# Patient Record
Sex: Male | Born: 1985
Health system: Southern US, Community
[De-identification: ages and names within clinical notes are randomized; demographics above are authoritative.]

## PROBLEM LIST (undated history)

## (undated) ENCOUNTER — Emergency Department (HOSPITAL_COMMUNITY): Admission: EM | Payer: Self-pay | Source: Home / Self Care

## (undated) DIAGNOSIS — Z789 Other specified health status: Secondary | ICD-10-CM

---

## 2015-06-22 DIAGNOSIS — M79672 Pain in left foot: Secondary | ICD-10-CM | POA: Diagnosis not present

## 2015-06-22 DIAGNOSIS — B079 Viral wart, unspecified: Secondary | ICD-10-CM | POA: Diagnosis not present

## 2015-06-22 DIAGNOSIS — L859 Epidermal thickening, unspecified: Secondary | ICD-10-CM | POA: Diagnosis not present

## 2015-07-10 DIAGNOSIS — B079 Viral wart, unspecified: Secondary | ICD-10-CM | POA: Diagnosis not present

## 2015-07-31 DIAGNOSIS — B079 Viral wart, unspecified: Secondary | ICD-10-CM | POA: Diagnosis not present

## 2016-02-01 DIAGNOSIS — B079 Viral wart, unspecified: Secondary | ICD-10-CM | POA: Diagnosis not present

## 2016-02-19 DIAGNOSIS — L03119 Cellulitis of unspecified part of limb: Secondary | ICD-10-CM | POA: Diagnosis not present

## 2016-02-19 DIAGNOSIS — B079 Viral wart, unspecified: Secondary | ICD-10-CM | POA: Diagnosis not present

## 2016-04-01 DIAGNOSIS — B079 Viral wart, unspecified: Secondary | ICD-10-CM | POA: Diagnosis not present

## 2016-06-06 ENCOUNTER — Encounter: Payer: Self-pay | Admitting: Podiatry

## 2016-06-06 ENCOUNTER — Ambulatory Visit (INDEPENDENT_AMBULATORY_CARE_PROVIDER_SITE_OTHER): Payer: 59

## 2016-06-06 ENCOUNTER — Ambulatory Visit (INDEPENDENT_AMBULATORY_CARE_PROVIDER_SITE_OTHER): Payer: 59 | Admitting: Podiatry

## 2016-06-06 VITALS — BP 94/74 | HR 67 | Resp 16 | Ht 76.0 in | Wt 170.0 lb

## 2016-06-06 DIAGNOSIS — M79672 Pain in left foot: Secondary | ICD-10-CM

## 2016-06-06 DIAGNOSIS — Q828 Other specified congenital malformations of skin: Secondary | ICD-10-CM

## 2016-06-06 DIAGNOSIS — M216X9 Other acquired deformities of unspecified foot: Secondary | ICD-10-CM

## 2016-06-06 DIAGNOSIS — M779 Enthesopathy, unspecified: Secondary | ICD-10-CM | POA: Diagnosis not present

## 2016-06-06 NOTE — Progress Notes (Signed)
   Subjective:    Patient ID: Timothy PomfretChristopher J Moody, male    DOB: January 13, 1986, 31 y.o.   MRN: [id number]  HPI Chief Complaint  Patient presents with  . Painful lesion    Left foot; plantar forefoot-below 5th toe; pt stated, "[place] to [place] podiatrist in [place] in February 2017 and April 01, 2016 and was told had a callous; did have a biopsy that said had callous; pt stated wants to have a second opinion"      Review of Systems  All other systems reviewed and are negative.      Objective:   Physical Exam        Assessment & Plan:

## 2016-06-08 NOTE — Progress Notes (Signed)
Subjective:     Patient ID: Timothy PomfretChristopher J Linam, male   DOB: 10-03-1985, 31 y.o.   MRN: [id number]  HPI patient presents with very painful lesion subsecond metatarsal left that has been trimmed in the past biopsies been done injection has been done in a remains a difficult problem for him   Review of Systems  All other systems reviewed and are negative.      Objective:   Physical Exam  Constitutional: He is oriented to person, place, and time.  Cardiovascular: Intact distal pulses.   Musculoskeletal: Normal range of motion.  Neurological: He is oriented to person, place, and time.  Skin: Skin is warm.  Nursing note and vitals reviewed.  neurovascular status intact muscle strength was adequate range of motion within normal limits with patient found to have a painful lesion subsecond metatarsal left with fluid buildup and lucent core. It is for the most part underneath the second metatarsal and slightly medial distal but appears to be related to functional problem     Assessment:     Chronic lesion which may be within skin itself or related to mechanical bone condition or combination of the tibia    Plan:     H&P x-rays reviewed condition discussed with family. At this time debridement accomplished and I did go ahead and scanned for orthotic to try to reduce the weightbearing forces against the lesion and we'll see back and I did discuss at one point this may require osteotomy with possible digital procedure. Patient will be seen back for us to reevaluate when orthotics are ready or earlier if needed  X-ray indicated the lesion is slightly distal and medial to the second metatarsal but certainly within the proximity of the bone itself

## 2016-06-09 ENCOUNTER — Encounter (HOSPITAL_COMMUNITY): Payer: Self-pay | Admitting: Emergency Medicine

## 2016-06-09 ENCOUNTER — Emergency Department (HOSPITAL_COMMUNITY)
Admission: EM | Admit: 2016-06-09 | Discharge: 2016-06-09 | Disposition: A | Discharge status: Home / Self Care | Payer: 59 | Attending: Emergency Medicine | Admitting: Emergency Medicine

## 2016-06-09 ENCOUNTER — Emergency Department (HOSPITAL_COMMUNITY): Payer: 59

## 2016-06-09 DIAGNOSIS — F17220 Nicotine dependence, chewing tobacco, uncomplicated: Secondary | ICD-10-CM | POA: Insufficient documentation

## 2016-06-09 DIAGNOSIS — Y999 Unspecified external cause status: Secondary | ICD-10-CM | POA: Diagnosis not present

## 2016-06-09 DIAGNOSIS — W231XXA Caught, crushed, jammed, or pinched between stationary objects, initial encounter: Secondary | ICD-10-CM | POA: Insufficient documentation

## 2016-06-09 DIAGNOSIS — Y929 Unspecified place or not applicable: Secondary | ICD-10-CM | POA: Insufficient documentation

## 2016-06-09 DIAGNOSIS — S61312A Laceration without foreign body of right middle finger with damage to nail, initial encounter: Secondary | ICD-10-CM | POA: Diagnosis not present

## 2016-06-09 DIAGNOSIS — S62632A Displaced fracture of distal phalanx of right middle finger, initial encounter for closed fracture: Secondary | ICD-10-CM | POA: Diagnosis not present

## 2016-06-09 DIAGNOSIS — Z23 Encounter for immunization: Secondary | ICD-10-CM | POA: Insufficient documentation

## 2016-06-09 DIAGNOSIS — S62632B Displaced fracture of distal phalanx of right middle finger, initial encounter for open fracture: Secondary | ICD-10-CM | POA: Diagnosis not present

## 2016-06-09 DIAGNOSIS — S62639B Displaced fracture of distal phalanx of unspecified finger, initial encounter for open fracture: Secondary | ICD-10-CM

## 2016-06-09 DIAGNOSIS — Y9389 Activity, other specified: Secondary | ICD-10-CM | POA: Diagnosis not present

## 2016-06-09 DIAGNOSIS — S61319A Laceration without foreign body of unspecified finger with damage to nail, initial encounter: Secondary | ICD-10-CM

## 2016-06-09 MED ORDER — TETANUS-DIPHTH-ACELL PERTUSSIS 5-2.5-18.5 LF-MCG/0.5 IM SUSP
0.5000 mL | Freq: Once | INTRAMUSCULAR | Status: AC
Start: 2016-06-09 — End: 2016-06-09
  Administered 2016-06-09: 0.5 mL via INTRAMUSCULAR
  Filled 2016-06-09: qty 0.5

## 2016-06-09 MED ORDER — AMOXICILLIN-POT CLAVULANATE 875-125 MG PO TABS: 1.0000 | ORAL_TABLET | Freq: Once | ORAL | Status: DC

## 2016-06-09 MED ORDER — CEPHALEXIN 500 MG PO CAPS: ORAL_CAPSULE | ORAL | 0 refills | Status: DC

## 2016-06-09 MED ORDER — CEPHALEXIN 500 MG PO CAPS
1000.0000 mg | ORAL_CAPSULE | Freq: Once | ORAL | Status: AC
  Administered 2016-06-09: 1000 mg via ORAL
  Filled 2016-06-09: qty 2

## 2016-06-09 MED ORDER — OXYCODONE-ACETAMINOPHEN 5-325 MG PO TABS
1.0000 | ORAL_TABLET | Freq: Once | ORAL | Status: AC
  Administered 2016-06-09: 1 via ORAL
  Filled 2016-06-09: qty 1

## 2016-06-09 MED ORDER — FENTANYL CITRATE (PF) 100 MCG/2ML IJ SOLN
100.0000 ug | Freq: Once | INTRAMUSCULAR | Status: AC
  Administered 2016-06-09: 100 ug via INTRAMUSCULAR
  Filled 2016-06-09: qty 2

## 2016-06-09 MED ORDER — LIDOCAINE HCL (PF) 1 % IJ SOLN
5.0000 mL | Freq: Once | INTRAMUSCULAR | Status: AC
  Administered 2016-06-09: 5 mL
  Filled 2016-06-09: qty 5

## 2016-06-09 MED ORDER — POVIDONE-IODINE 10 % EX SOLN
CUTANEOUS | Status: AC
  Filled 2016-06-09: qty 118

## 2016-06-09 MED ORDER — OXYCODONE-ACETAMINOPHEN 5-325 MG PO TABS: 1.0000 | ORAL_TABLET | Freq: Three times a day (TID) | ORAL | 0 refills | Status: DC | PRN

## 2016-06-09 NOTE — ED Triage Notes (Signed)
Pt reports was cutting wood and reports "pinched" right middle finger between two pieces. Pt moderately diaphoretic. Bleeding controlled at this time. Laceration extends around anterior tip of right middle finger.

## 2016-06-09 NOTE — ED Provider Notes (Signed)
[facility]EM[facility]Y DEPT Provider Note   CSN: [id number] Arrival date & time: 06/09/16  1250  By signing my name below, I, Timothy Moody, attest that this documentation has been prepared under the direction and in the presence of [name], MD. Electronically Signed: Modena Moody, Scribe. 06/09/2016. 1:40 PM.  History   Chief Complaint Chief Complaint  Patient presents with  . Laceration   The history is provided by the patient. No language interpreter was used.   HPI Comments: Timothy Moody is a 31 y.o. male who presents to the Emergency Department complaining of right 4th finger wound that occurred an hour ago. He was working with wood when he accidentally got his fingertip smashed between two pieces of wood. He had a bandage placed PTA. Unsure of tetanus status. He is right-handed. He denies any other complaints.   History reviewed. No pertinent past medical history.  There are no active problems to display for this patient.   History reviewed. No pertinent surgical history.     Home Medications    Prior to Admission medications   Medication Sig Start Date End Date Taking? Authorizing Provider  cephALEXin (KEFLEX) 500 MG capsule 2 caps po bid x 7 days 06/09/16   [name], MD  oxyCODONE-acetaminophen (PERCOCET) 5-325 MG tablet Take 1-2 tablets by mouth every 8 (eight) hours as needed for severe pain. 06/09/16   [name], MD    Family History History reviewed. No pertinent family history.  Social History Social History  Substance Use Topics  . Smoking status: Never Smoker  . Smokeless tobacco: Current User    Types: Chew  . Alcohol use Not on file     Allergies   Patient has no known allergies.   Review of Systems Review of Systems  Skin: Positive for wound (Right 4th finger).    Physical Exam Updated Vital Signs BP 115/70 (BP Location: Left Arm)   Pulse 63   Temp 97.7 F (36.5 C) (Oral)   Resp 18   Ht 6\' 4"  (1.93 m)   Wt 170 lb (77.1 kg)    SpO2 100%   BMI 20.69 kg/m   Physical Exam  Constitutional: He appears well-developed and well-nourished. No distress.  HENT:  Head: Normocephalic and atraumatic.  Eyes: Conjunctivae are normal.  Neck: Neck supple.  Cardiovascular: Normal rate.   Pulmonary/Chest: Effort normal.  Abdominal: Soft.  Musculoskeletal: Normal range of motion.  Neurological: He is alert.  Skin: Skin is warm and dry.  From volar aspect to proximal nailbed. Finger has good sensation distal to wound. Unable to assess cap. Refill. Dorsal aspect is dusky.   Psychiatric: He has a normal mood and affect.  Nursing note and vitals reviewed.      After cleaning and nail removal:    Approximated as best as possible      ED Treatments / Results  DIAGNOSTIC STUDIES: Oxygen Saturation is 100% on RA, normal by my interpretation.    COORDINATION OF CARE: 1:44 PM- Pt advised of plan for treatment and pt agrees.  Labs (all labs ordered are listed, but only abnormal results are displayed) Labs Reviewed - No data to display  EKG  EKG Interpretation None       Radiology Dg Finger Middle Right  Result Date: 06/09/2016 CLINICAL DATA:  Right third finger injury EXAM: RIGHT MIDDLE FINGER 2+V COMPARISON:  None. FINDINGS: Overlying bandage obscures fine bone detail. Comminuted non articular fracture of the distal tuft of the distal phalanx in the right third  finger with minimal 1-2 mm volar displacement of the dominant distal fracture fragment. No additional fracture. No dislocation. No suspicious focal osseous lesion. Minimal osteoarthritis in the DIP joint right third finger. No radiopaque foreign body. IMPRESSION: Comminuted non articular distal tuft fracture in the distal phalanx of the right third finger with minimal displacement as described. Electronically Signed   By: Delbert PhenixJason A Poff M.D.   On: 06/09/2016 13:24    Procedures .Marland Kitchen.Laceration Repair Date/Time: 06/09/2016 4:44 PM Performed by: Marily MemosMESNER,  Shoshannah Faubert Authorized by: Marily MemosMESNER, Mckenzie Toruno   Consent:    Consent obtained:  Verbal   Consent given by:  Patient   Risks discussed:  Infection, pain, poor cosmetic result, poor wound healing, vascular damage, tendon damage, nerve damage and need for additional repair   Alternatives discussed:  No treatment and delayed treatment Anesthesia (see MAR for exact dosages):    Anesthesia method:  Nerve block   Block location:  Digital, fifth digit, palmar crease   Block needle gauge:  24 G   Block anesthetic:  Lidocaine 1% w/o epi   Block technique:  Digital    Block injection procedure:  Anatomic landmarks identified, introduced needle and negative aspiration for blood   Block outcome:  Anesthesia achieved Laceration details:    Location:  Finger   Finger location:  R long finger   Length (cm):  3   Depth (mm):  8 Repair type:    Repair type:  Complex Pre-procedure details:    Preparation:  Patient was prepped and draped in usual sterile fashion and imaging obtained to evaluate for foreign bodies Exploration:    Hemostasis achieved with:  Tourniquet   Wound exploration: wound explored through full range of motion and entire depth of wound probed and visualized     Wound extent: areolar tissue violated, fascia violated, muscle damage, tendon damage, underlying fracture and vascular damage     Wound extent: no foreign bodies/material noted and no nerve damage noted     Contaminated: no   Treatment:    Area cleansed with:  Betadine and saline   Amount of cleaning:  Standard   Irrigation solution:  Sterile saline   Irrigation volume:  200   Irrigation method:  Syringe   Visualized foreign bodies/material removed: no     Debridement:  Moderate   Undermining:  Minimal   Scar revision: no   Skin repair:    Repair method:  Sutures   Suture size:  4-0   Suture material:  Prolene   Suture technique:  Simple interrupted   Number of sutures:  4 Approximation:    Approximation:   Close Post-procedure details:    Dressing:  Sterile dressing, bulky dressing, splint for protection and tube gauze   Patient tolerance of procedure:  Tolerated well, no immediate complications .Nerve Block Date/Time: 06/09/2016 4:47 PM Performed by: Marily MemosMESNER, Chrystina Naff Authorized by: Marily MemosMESNER, Alexes Menchaca   Consent:    Consent obtained:  Verbal   Consent given by:  Patient Indications:    Indications:  Procedural anesthesia Location:    Nerve block body site: right long finger, digital.   Laterality:  Right Pre-procedure details:    Skin preparation:  Povidone-iodine   Preparation: Patient was prepped and draped in usual sterile fashion   Skin anesthesia (see MAR for exact dosages):    Skin anesthesia method:  None Procedure details (see MAR for exact dosages):    Block needle gauge:  25 G   Guidance comment:  Anatomic landmark   Anesthetic injected:  Lidocaine 1% w/o epi   Steroid injected:  None   Additive injected:  None   Injection procedure:  Anatomic landmarks identified   Paresthesia:  Immediately resolved Post-procedure details:    Dressing:  Sterile dressing   Outcome:  Anesthesia achieved   Patient tolerance of procedure:  Tolerated well, no immediate complications   (including critical care time)  Medications Ordered in ED Medications  povidone-iodine (BETADINE) 10 % external solution (not administered)  lidocaine (PF) (XYLOCAINE) 1 % injection 5 mL (5 mLs Other Given 06/09/16 1424)  fentaNYL (SUBLIMAZE) injection 100 mcg (100 mcg Intramuscular Given 06/09/16 1422)  Tdap (BOOSTRIX) injection 0.5 mL (0.5 mLs Intramuscular Given 06/09/16 1423)  cephALEXin (KEFLEX) capsule 1,000 mg (1,000 mg Oral Given 06/09/16 1546)  oxyCODONE-acetaminophen (PERCOCET/ROXICET) 5-325 MG per tablet 1 tablet (1 tablet Oral Given 06/09/16 1643)     Initial Impression / Assessment and Plan / ED Course  I have reviewed the triage vital signs and the nursing notes.  Pertinent labs & imaging results  that were available during my care of the patient were reviewed by me and considered in my medical decision making (see chart for details).     32 year old male with finger injury as documented by PICTURES above. Patient with significant injury to nail bed, matrix and underlying phalanx. Tissue approximated as well as possible with remaining viable tissue. It covers the bone. I discussed that the nail will likely never grow normal and is a possibility this will not heal well he may need a partial amputation he and his family understood. I did discuss this with Dr. Romeo Apple on the phone and will see the patient in follow-up. Family will call and get a follow-up appointment this week. Secondary to the fracture and the complexity of the wound overlying it after being smashed by a piece of wood I will start him on antibiotics. Discharged on pain medicine as well. We'll keep dressing in place until he sees Dr. Romeo Apple.  Final Clinical Impressions(s) / ED Diagnoses   Final diagnoses:  Laceration of right middle finger without foreign body with damage to nail, initial encounter  Open fracture of tuft of distal phalanx of finger  Nailbed laceration, finger, initial encounter    New Prescriptions New Prescriptions   CEPHALEXIN (KEFLEX) 500 MG CAPSULE    2 caps po bid x 7 days   OXYCODONE-ACETAMINOPHEN (PERCOCET) 5-325 MG TABLET    Take 1-2 tablets by mouth every 8 (eight) hours as needed for severe pain.   I personally performed the services described in this documentation, which was scribed in my presence. The recorded information has been reviewed and is accurate.     [name], MD 06/09/16 [phone]

## 2016-06-10 ENCOUNTER — Encounter: Payer: Self-pay | Admitting: Orthopedic Surgery

## 2016-06-10 ENCOUNTER — Ambulatory Visit (INDEPENDENT_AMBULATORY_CARE_PROVIDER_SITE_OTHER): Payer: 59 | Admitting: Orthopedic Surgery

## 2016-06-10 VITALS — BP 129/83 | HR 64 | Ht 76.0 in | Wt 163.0 lb

## 2016-06-10 DIAGNOSIS — S62662A Nondisplaced fracture of distal phalanx of right middle finger, initial encounter for closed fracture: Secondary | ICD-10-CM | POA: Diagnosis not present

## 2016-06-10 MED ORDER — OXYCODONE-ACETAMINOPHEN 5-325 MG PO TABS: 1.0000 | ORAL_TABLET | Freq: Three times a day (TID) | ORAL | 0 refills | Status: DC | PRN

## 2016-06-10 NOTE — Progress Notes (Signed)
Patient ID: Timothy Moody, male   DOB: December 03, 1985, 31 y.o.   MRN: [id number]  Chief Complaint  Patient presents with  . Hand Injury    right long finger crush injury, DOI 06/09/16   MEDICAL DECISION MAKING:    Data Reviewed I reviewed his x-rays and reports. My interpretation is a distal tuft fracture minimal displacement  Assessment Encounter Diagnosis  Name Primary?  . Closed nondisplaced fracture of distal phalanx of right middle finger, initial encounter Yes     Plan We change the dressing was changed again on Friday. Stitches out in 7-10 days post injury  Follow-up Friday  HPI Timothy PomfretChristopher J Mariscal is a 31 y.o. male.  31 year old male was cutting some wood the log dropped on his right long finger and he injured the distal tip sustaining an open distal tuft fracture which was treated in the emergency room yesterday date of injury  Complains of pain.  Review of Systems Review of Systems He denies numbness in the fingertip  No past medical history on file.  No past surgical history on file.  Social History Social History  Substance Use Topics  . Smoking status: Never Smoker  . Smokeless tobacco: Current User    Types: Chew  . Alcohol use Not on file    No Known Allergies  Current Meds  Medication Sig  . cephALEXin (KEFLEX) 500 MG capsule 2 caps po bid x 7 days  . oxyCODONE-acetaminophen (PERCOCET) 5-325 MG tablet Take 1 tablet by mouth every 8 (eight) hours as needed for severe pain.  . [DISCONTINUED] oxyCODONE-acetaminophen (PERCOCET) 5-325 MG tablet Take 1-2 tablets by mouth every 8 (eight) hours as needed for severe pain.      Physical Exam Physical Exam 1.BP 129/83   Pulse 64   Ht 6\' 4"  (1.93 m)   Wt 163 lb (73.9 kg)   BMI 19.84 kg/m   2. Gen. appearance. The patient is well-developed and well-nourished, grooming and hygiene are normal. There are no gross congenital abnormalities  3. The patient is alert and oriented to person place and  time  4. Mood and affect are normal  Right long finger multiple stitches are in place overall alignment is reasonable. He has normal sensation if not increased sensation at the tip of the finger. He cannot really flex the fingertip at this point which is not unusual. Capillary refill was good.   Fuller CanadaStanley Nikoleta Dady 06/10/2016, 4:10 PM

## 2016-06-10 NOTE — Patient Instructions (Signed)
Keep clean and keep dry

## 2016-06-13 ENCOUNTER — Ambulatory Visit (INDEPENDENT_AMBULATORY_CARE_PROVIDER_SITE_OTHER): Payer: 59 | Admitting: Orthopedic Surgery

## 2016-06-13 DIAGNOSIS — S62662A Nondisplaced fracture of distal phalanx of right middle finger, initial encounter for closed fracture: Secondary | ICD-10-CM | POA: Diagnosis not present

## 2016-06-16 NOTE — Progress Notes (Signed)
Status post closed nondisplaced fracture of the distal phalanx right long finger  Dressing was changed wound looked clean dry and intact  Significant other is a nurse and she will change the dressing daily  Follow-up next week for suture removal

## 2016-06-17 ENCOUNTER — Encounter: Payer: Self-pay | Admitting: Orthopedic Surgery

## 2016-06-17 ENCOUNTER — Ambulatory Visit (INDEPENDENT_AMBULATORY_CARE_PROVIDER_SITE_OTHER): Payer: 59 | Admitting: Orthopedic Surgery

## 2016-06-17 DIAGNOSIS — S62662A Nondisplaced fracture of distal phalanx of right middle finger, initial encounter for closed fracture: Secondary | ICD-10-CM

## 2016-06-17 NOTE — Progress Notes (Signed)
FOLLOW UP VISIT   Patient ID: Timothy Moody, male   DOB: June 05, 1985, 31 y.o.   MRN: [id number]  Chief Complaint  Patient presents with  . Follow-up    RIGHT MIDDLE FINGER WOUND, DOI 06/09/16    HPI Timothy Moody is a 31 y.o. male.   HPI  8 DAYS POST CRUSH INJURY RIGHT LONG FINGER   DAILY DRESSING CHANGES   SUTURES OUT TODAY   HES ON KEFLEX AND OXYCODONE    Review of Systems Review of Systems   Physical Exam  MILD SWELLING , CLEAN WOUND BED    MEDICAL DECISION MAKING  DATA     Encounter Diagnosis  Name Primary?  . Closed nondisplaced fracture of distal phalanx of right middle finger, initial encounter Yes    DRESSINGS DAILY START SOAKING WITH SALT AND ANTIBACTERIAL DISH DETERGENT   PLAN(RISK)    RETURN WEDS FOR WOUND CHECK

## 2016-06-27 ENCOUNTER — Ambulatory Visit (INDEPENDENT_AMBULATORY_CARE_PROVIDER_SITE_OTHER): Payer: 59 | Admitting: Orthopedic Surgery

## 2016-06-27 DIAGNOSIS — S62662D Nondisplaced fracture of distal phalanx of right middle finger, subsequent encounter for fracture with routine healing: Secondary | ICD-10-CM

## 2016-06-27 MED ORDER — CEPHALEXIN 500 MG PO CAPS: ORAL_CAPSULE | ORAL | 2 refills | Status: DC

## 2016-06-27 NOTE — Addendum Note (Signed)
Addended by: Fuller Canada E on: 06/27/2016 11:15 AM   Modules accepted: Orders

## 2016-06-27 NOTE — Progress Notes (Signed)
Chief Complaint  Patient presents with  . Wound Check    RIGHT MIDDLE FINGER DOI-06/09/16    Current medications Keflex and oxycodone  Daily dressing changes  Status post crushing injury to right long finger  Meds ordered this encounter  Medications  . cephALEXin (KEFLEX) 500 MG capsule    Sig: 2 caps po bid x 7 days    Dispense:  56 capsule    Refill:  2    It looks good at this point no odor ,   Flexion is 80%  DRESSING CHANGES AND ANTIBIOTICS   2 WK FU

## 2016-06-27 NOTE — Patient Instructions (Signed)
Resume keflex  Daily dressing changes and soak

## 2016-06-30 ENCOUNTER — Other Ambulatory Visit: Payer: 59

## 2016-07-11 ENCOUNTER — Ambulatory Visit: Payer: 59 | Admitting: Orthopedic Surgery

## 2016-07-14 ENCOUNTER — Ambulatory Visit (INDEPENDENT_AMBULATORY_CARE_PROVIDER_SITE_OTHER): Payer: 59 | Admitting: Orthopedic Surgery

## 2016-07-14 ENCOUNTER — Encounter: Payer: Self-pay | Admitting: Orthopedic Surgery

## 2016-07-14 DIAGNOSIS — S62662D Nondisplaced fracture of distal phalanx of right middle finger, subsequent encounter for fracture with routine healing: Secondary | ICD-10-CM | POA: Diagnosis not present

## 2016-07-14 NOTE — Progress Notes (Signed)
Chief Complaint  Patient presents with  . Follow-up    right long finger crush injury, DOI 06/09/16    Follow-up crush injury right long finger. It looks really good today. No male coming just yet but as far as infection none seen or noted.  He's regain full range of motion  Continue soaks cover to keep clean and follow-up with me in a month

## 2016-08-13 ENCOUNTER — Ambulatory Visit (HOSPITAL_COMMUNITY)
Admission: EM | Admit: 2016-08-13 | Discharge: 2016-08-13 | Disposition: A | Discharge status: Home / Self Care | Payer: 59 | Attending: Family Medicine | Admitting: Family Medicine

## 2016-08-13 ENCOUNTER — Encounter (HOSPITAL_COMMUNITY): Payer: Self-pay | Admitting: Emergency Medicine

## 2016-08-13 DIAGNOSIS — J028 Acute pharyngitis due to other specified organisms: Secondary | ICD-10-CM | POA: Diagnosis not present

## 2016-08-13 MED ORDER — LIDOCAINE VISCOUS 2 % MT SOLN: OROMUCOSAL | 0 refills | Status: DC

## 2016-08-13 MED ORDER — AMOXICILLIN 875 MG PO TABS: 875.0000 mg | ORAL_TABLET | Freq: Two times a day (BID) | ORAL | 0 refills | Status: DC

## 2016-08-13 NOTE — ED Triage Notes (Signed)
Pt has been suffering from a sore throat, N&V for two days.  Report of feeling hot, but no measured temperature taken at home.

## 2016-08-13 NOTE — ED Provider Notes (Signed)
CSN: [id number]     Arrival date & time 08/13/16  1004 History   First MD Initiated Contact with Patient 08/13/16 1034     Chief Complaint  Patient presents with  . Sore Throat  . Fever   (Consider location/radiation/quality/duration/timing/severity/associated sxs/prior Treatment) Patient c/o sore throat and N/V for 2 days.   The history is provided by the patient and a relative.  Sore Throat  This is a new problem. The current episode started 2 days ago. The problem occurs constantly. The problem has not changed since onset.Nothing aggravates the symptoms. He has tried nothing for the symptoms.  Fever  Associated symptoms: congestion and sore throat     History reviewed. No pertinent past medical history. History reviewed. No pertinent surgical history. History reviewed. No pertinent family history. Social History  Substance Use Topics  . Smoking status: Never Smoker  . Smokeless tobacco: Current User    Types: Chew  . Alcohol use Not on file    Review of Systems  Constitutional: Positive for fever.  HENT: Positive for congestion and sore throat.   Eyes: Negative.   Respiratory: Negative.   Cardiovascular: Negative.   Gastrointestinal: Negative.   Endocrine: Negative.   Genitourinary: Negative.   Musculoskeletal: Negative.   Allergic/Immunologic: Negative.   Neurological: Negative.   Hematological: Negative.   Psychiatric/Behavioral: Negative.     Allergies  Patient has no known allergies.  Home Medications   Prior to Admission medications   Medication Sig Start Date End Date Taking? Authorizing Provider  amoxicillin (AMOXIL) 875 MG tablet Take 1 tablet (875 mg total) by mouth 2 (two) times daily. 08/13/16   Deatra Canter, FNP  lidocaine (XYLOCAINE) 2 % solution Take 10 ml po q 4 hours prn throat pain 08/13/16   Deatra Canter, FNP   Meds Ordered and Administered this Visit  Medications - No data to display  BP 140/62 (BP Location: Left Arm)   Pulse  89   Temp 99.5 F (37.5 C) (Oral)   SpO2 98%  No data found.   Physical Exam  Constitutional: He is oriented to person, place, and time. He appears well-developed and well-nourished.  HENT:  Head: Normocephalic and atraumatic.  Right Ear: External ear normal.  Left Ear: External ear normal.  Nose: Nose normal.  opx erythematous and tonsils 2 plus with exudates.  Eyes: Conjunctivae and EOM are normal. Pupils are equal, round, and reactive to light.  Neck: Normal range of motion. Neck supple.  Cardiovascular: Normal rate, regular rhythm and normal heart sounds.   Pulmonary/Chest: Effort normal and breath sounds normal.  Abdominal: Soft. Bowel sounds are normal.  Musculoskeletal: Normal range of motion.  Lymphadenopathy:    He has cervical adenopathy.  Neurological: He is alert and oriented to person, place, and time.  Nursing note and vitals reviewed.   Urgent Care Course     Procedures (including critical care time)  Labs Review Labs Reviewed - No data to display  Imaging Review No results found.   Visual Acuity Review  Right Eye Distance:   Left Eye Distance:   Bilateral Distance:    Right Eye Near:   Left Eye Near:    Bilateral Near:         MDM   1. Pharyngitis due to other organism    Lidocaine viscous Amoxicillin  Push po fluids, rest, tylenol and motrin otc prn as directed for fever, arthralgias, and myalgias.  Follow up prn if sx's continue or persist.  Deatra CanterOxford, Raaga Maeder J, FNP 08/13/16 1137

## 2016-08-15 ENCOUNTER — Encounter: Payer: Self-pay | Admitting: Orthopedic Surgery

## 2016-08-15 ENCOUNTER — Ambulatory Visit (INDEPENDENT_AMBULATORY_CARE_PROVIDER_SITE_OTHER): Payer: 59 | Admitting: Orthopedic Surgery

## 2016-08-15 DIAGNOSIS — S62662D Nondisplaced fracture of distal phalanx of right middle finger, subsequent encounter for fracture with routine healing: Secondary | ICD-10-CM | POA: Diagnosis not present

## 2016-08-15 NOTE — Progress Notes (Signed)
Chief Complaint  Patient presents with  . Follow-up    RIGHT LONG FINGER INJURY, DOI 06/09/16    Timothy Moody had an injury to his right long finger and we treated it with antibiotics and soaks and he did well. Comes in today complaining of some purulent material expressed within the last day or 2 from the cuticle area. He also gives history of recent diagnosis of strep so is on amoxicillin  Exam shows a regular nail full range of motion normal sensation and area of erythema at the edge of the cuticle no expressible purulence a day  Recommend soak daily with warm water and Epsom salt continuous 7 day course of amoxicillin and follow-up in 4 weeks   Encounter Diagnosis  Name Primary?  . Closed nondisplaced fracture of distal phalanx of right middle finger with routine healing, subsequent encounter Yes

## 2016-09-12 ENCOUNTER — Ambulatory Visit: Payer: 59 | Admitting: Orthopedic Surgery

## 2016-09-15 ENCOUNTER — Encounter: Payer: Self-pay | Admitting: Orthopedic Surgery

## 2016-09-15 ENCOUNTER — Ambulatory Visit (INDEPENDENT_AMBULATORY_CARE_PROVIDER_SITE_OTHER): Payer: 59 | Admitting: Orthopedic Surgery

## 2016-09-15 DIAGNOSIS — S62662D Nondisplaced fracture of distal phalanx of right middle finger, subsequent encounter for fracture with routine healing: Secondary | ICD-10-CM

## 2016-09-15 NOTE — Progress Notes (Signed)
31 year old male follow-up status post right  long finger injury he has regained full range of motion is no tenderness his nail is slightly deformed. He will follow-up with us as needed

## 2017-05-05 ENCOUNTER — Encounter: Payer: Self-pay | Admitting: Family Medicine

## 2017-05-05 ENCOUNTER — Ambulatory Visit (INDEPENDENT_AMBULATORY_CARE_PROVIDER_SITE_OTHER): Payer: No Typology Code available for payment source | Admitting: Family Medicine

## 2017-05-05 VITALS — BP 138/72 | Ht 75.5 in | Wt 171.6 lb

## 2017-05-05 DIAGNOSIS — Z0000 Encounter for general adult medical examination without abnormal findings: Secondary | ICD-10-CM | POA: Diagnosis not present

## 2017-05-05 DIAGNOSIS — Z79899 Other long term (current) drug therapy: Secondary | ICD-10-CM | POA: Diagnosis not present

## 2017-05-05 DIAGNOSIS — Z13220 Encounter for screening for lipoid disorders: Secondary | ICD-10-CM | POA: Diagnosis not present

## 2017-05-05 NOTE — Progress Notes (Signed)
   Subjective:    Patient ID: Timothy PomfretChristopher J Yakubov, male    DOB: Dec 24, 1985, 32 y.o.   MRN: [id number]  HPI The patient comes in today for a wellness visit.    A review of their health history was completed.  A review of medications was also completed.  Any needed refills;  Eating habits: good, "eats what he wants"  Falls/  MVA accidents in past few months: no  Regular exercise: yes  Specialist pt sees on regular basis: none Preventative health issues were discussed.   Additional concerns: none  Plenty execise with work , does heavy Regulatory affairs officerwood cutting  Eats a bit late fast food  No hospitalziations  No major surgeries   n dips some   occas  alcohl use  Couple beeers here and there    Review of Systems  Constitutional: Negative for activity change, appetite change and fever.  HENT: Negative for congestion and rhinorrhea.   Eyes: Negative for discharge.  Respiratory: Negative for cough and wheezing.   Cardiovascular: Negative for chest pain.  Gastrointestinal: Negative for abdominal pain, blood in stool and vomiting.  Genitourinary: Negative for difficulty urinating and frequency.  Musculoskeletal: Negative for neck pain.  Skin: Negative for rash.  Allergic/Immunologic: Negative for environmental allergies and food allergies.  Neurological: Negative for weakness and headaches.  Psychiatric/Behavioral: Negative for agitation.  All other systems reviewed and are negative.      Objective:    Physical Exam  Constitutional: He appears well-developed and well-nourished.  HENT:  Head: Normocephalic and atraumatic.  Right Ear: External ear normal.  Left Ear: External ear normal.  Nose: Nose normal.  Mouth/Throat: Oropharynx is clear and moist.  Eyes: Right eye exhibits no discharge. Left eye exhibits no discharge. No scleral icterus.  Neck: Normal range of motion. Neck supple. No thyromegaly present.  Cardiovascular: Normal rate, regular rhythm and normal heart  sounds.  No murmur heard. Pulmonary/Chest: Effort normal and breath sounds normal. No respiratory distress. He has no wheezes.  Abdominal: Soft. Bowel sounds are normal. He exhibits no distension and no mass. There is no tenderness.  Genitourinary: Penis normal.  Musculoskeletal: Normal range of motion. He exhibits no edema.  Lymphadenopathy:    He has no cervical adenopathy.  Neurological: He is alert. He exhibits normal muscle tone. Coordination normal.  Skin: Skin is warm and dry. No erythema.  Psychiatric: He has a normal mood and affect. His behavior is normal. Judgment normal.  Vitals reviewed.         Assessment & Plan:  Impression 1 well adult exam.  Diet discussed.  Exercise discussed.  Appropriate blood work recommended.  Patient dips tobacco advised to quit

## 2018-10-04 IMAGING — DX DG FINGER MIDDLE 2+V*R*
3 series · 3 of 3 positions shown · non-contrast
Comparison: None.

CLINICAL DATA: Right third finger injury

EXAM:
RIGHT MIDDLE FINGER 2+V

[finger ap]
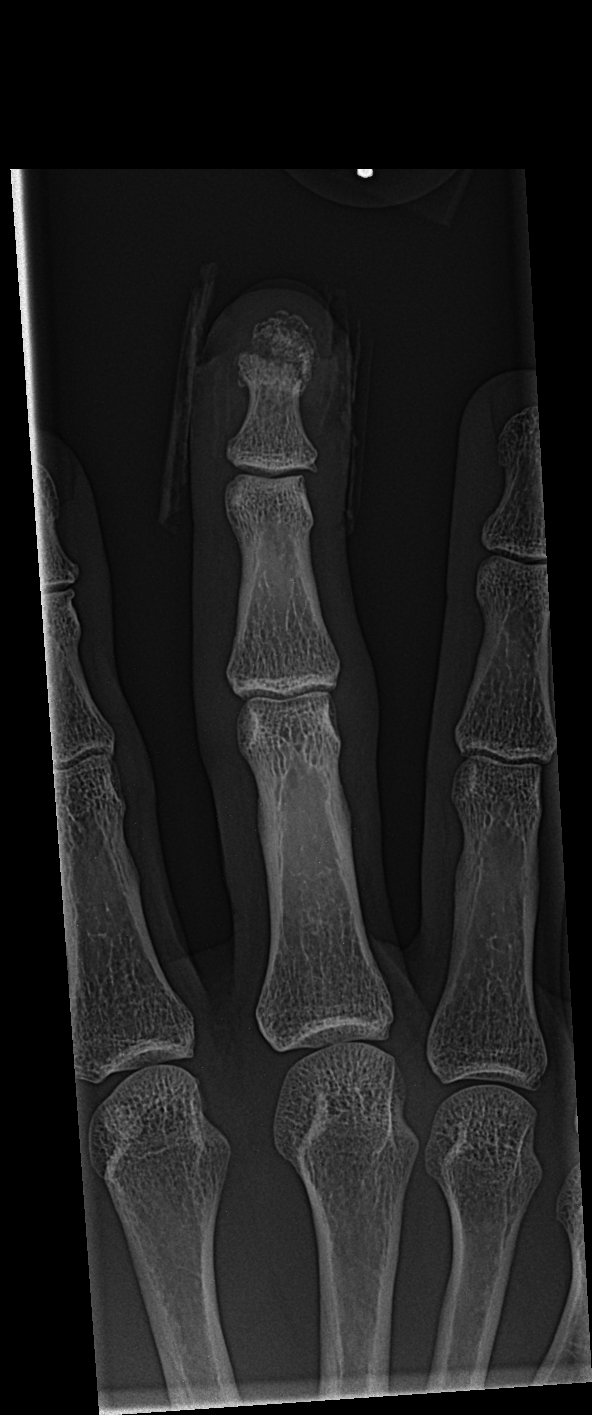

[finger obl]
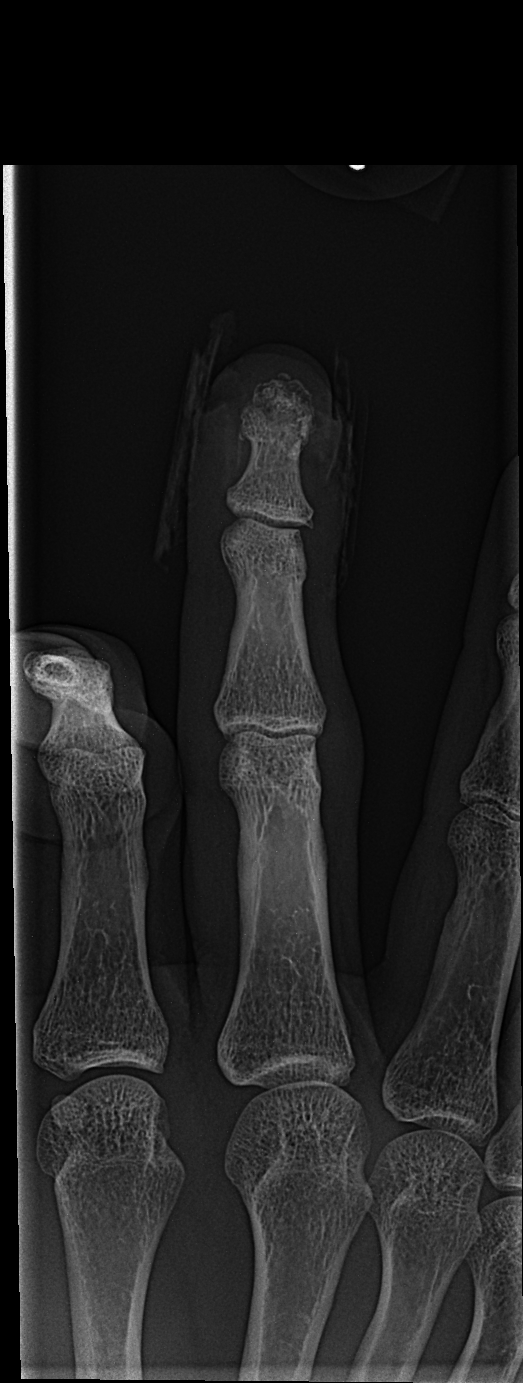

[finger lat]
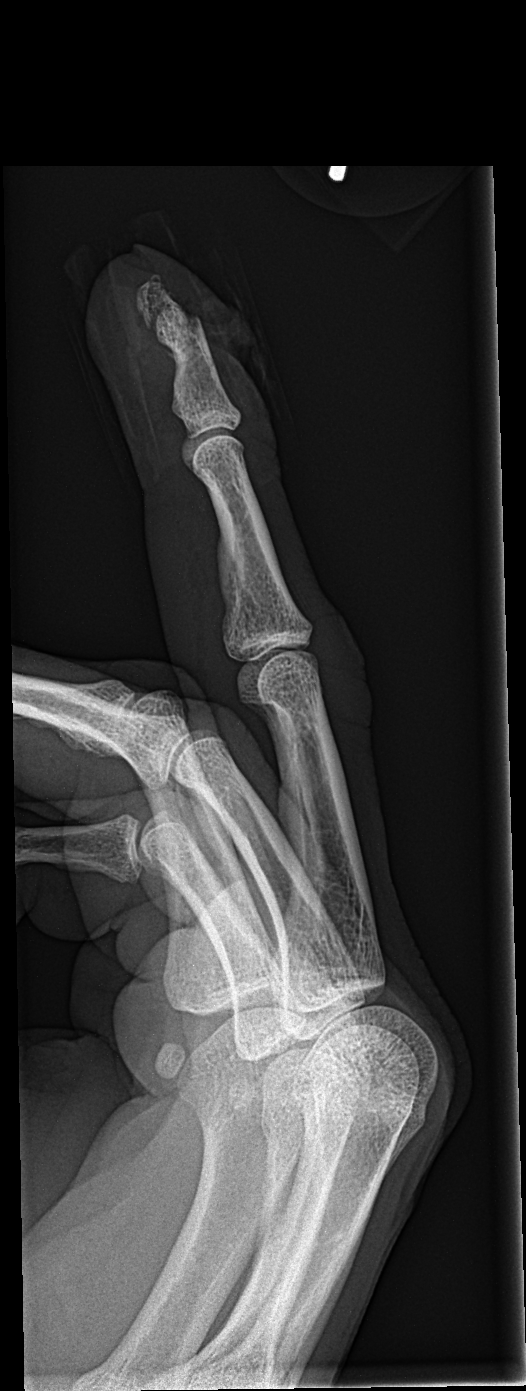

[3 of 3 positions shown; findings below may reference images not displayed]

FINDINGS: Overlying bandage obscures fine bone detail. Comminuted non
articular fracture of the distal tuft of the distal phalanx in the
right third finger with minimal 1-2 mm volar displacement of the
dominant distal fracture fragment. No additional fracture. No
dislocation. No suspicious focal osseous lesion. Minimal
osteoarthritis in the DIP joint right third finger. No radiopaque
foreign body.
IMPRESSION: Comminuted non articular distal tuft fracture in the distal phalanx
of the right third finger with minimal displacement as described.

## 2018-10-27 ENCOUNTER — Telehealth: Payer: Self-pay | Admitting: Family Medicine

## 2018-10-27 DIAGNOSIS — M25561 Pain in right knee: Secondary | ICD-10-CM

## 2018-10-27 NOTE — Telephone Encounter (Signed)
Patient notified

## 2018-10-27 NOTE — Telephone Encounter (Signed)
Referral ordered in Epic. Left message to return call to notify patient. 

## 2018-10-27 NOTE — Telephone Encounter (Signed)
Pt needs referral to Dr. Aline Brochure, right knee pain & swelling 2-3 months, worsening   Call pt - [phone]    Pt has [facility] plan & referral is required

## 2018-10-27 NOTE — Telephone Encounter (Signed)
Let's do 

## 2018-11-03 ENCOUNTER — Encounter: Payer: Self-pay | Admitting: Orthopedic Surgery

## 2018-11-03 ENCOUNTER — Other Ambulatory Visit: Payer: Self-pay

## 2018-11-03 ENCOUNTER — Ambulatory Visit: Payer: No Typology Code available for payment source | Admitting: Orthopedic Surgery

## 2018-11-03 VITALS — BP 124/81 | HR 75 | Ht 75.5 in | Wt 170.0 lb

## 2018-11-03 DIAGNOSIS — M7041 Prepatellar bursitis, right knee: Secondary | ICD-10-CM | POA: Diagnosis not present

## 2018-11-03 NOTE — Progress Notes (Signed)
Timothy Moody  11/03/2018  HISTORY SECTION :  Chief Complaint  Patient presents with  . Knee Pain    right knee swelling at patella    HPI The patient presents for evaluation of swelling right knee patella no trauma other than his work.  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  Has had swelling over the knee for 4 to 6 weeks not really too much pain may be some mild discomfort not getting better  Review of Systems  All other systems reviewed and are negative.    History reviewed. No pertinent past medical history.  Denies diabetes hypertension or heart disease History reviewed. No pertinent surgical history.  No prior surgery   No Known Allergies  No current outpatient medications on file.   PHYSICAL EXAM SECTION: 1) BP 124/81   Pulse 75   Ht 6' 3.5" (1.918 m)   Wt 170 lb (77.1 kg)   BMI 20.97 kg/m   Body mass index is 20.97 kg/m. General appearance: Well-developed well-nourished no gross deformities  2) Cardiovascular normal pulse and perfusion in all 4 extremities normal color without edema  3) Neurologically deep tendon reflexes are equal and normal, no sensation loss or deficits no pathologic reflexes  4) Psychological: Awake alert and oriented x3 mood and affect normal  5) Skin no lacerations or ulcerations no nodularity no palpable masses, no erythema or nodularity  6) Musculoskeletal: *  Left knee looks normal nontender normal range of motion  Right knee large prepatellar bursa no tenderness no erythema full range of motion of the knee knee is stable strength is normal muscle tone excellent  MEDICAL DECISION SECTION:  Encounter Diagnosis  Name Primary?  . Prepatellar bursitis of right knee Yes    Imaging No x-rays  Plan:  (Rx., Inj., surg., Frx, MRI/CT, XR:2)  Aspiration injection right prepatellar bursa  Patient gave consent Timeout was taken [place].  Skin was cleaned with alcohol prep with ethyl  chloride 18-gauge needle aspirated 78 cc of blood we injected 1 and half cc of Depo-Medrol with lidocaine  3:26 PM Arther Abbott, MD  11/03/2018

## 2018-11-03 NOTE — Patient Instructions (Signed)
Prepatellar Bursitis  Prepatellar bursitis is inflammation of the prepatellar bursa, which is a fluid-filled sac that cushions the kneecap (patella). Prepatellar bursitis happens when fluid builds up in this sac and causes it to swell. The condition causes knee pain. What are the causes? This condition may be caused by:  Constant pressure on the knees from kneeling.  A hit to the knee.  Falling on the knee.  Infection from bacteria.  Moving the knee often in a forceful way. What increases the risk? You are more likely to develop this condition if:  You play sports that have a high risk of falling on the knee or being hit on the knee. These include football, wrestling, basketball, or soccer.  You do work in which you kneel for long periods of time, such as roofing, plumbing, or gardening.  You have another inflammatory condition, such as gout or rheumatoid arthritis. What are the signs or symptoms? The most common symptom of this condition is knee pain that gets better with rest. Other symptoms include:  Swelling on the front of the kneecap.  Warmth in the knee.  Tenderness with activity.  Redness in the knee.  Inability to bend the knee or to kneel. How is this diagnosed? This condition is diagnosed based on:  A physical exam. Your health care provider will compare your knees and check for tenderness and pain while moving your knee.  Your medical history.  Tests to check for infection. These may include blood tests and tests on the fluid in the bursa.  Imaging tests, such as X-ray, MRI, or ultrasound, to check for damage in the patella, or fluid buildup and swelling in the bursa. How is this treated? This condition may be treated by:  Resting the knee.  Putting ice on the knee.  Taking medicines, such as: ? NSAIDs. These can help to reduce pain and swelling. ? Antibiotics. These may be needed if you have an infection. ? Steroids. These are used to reduce  swelling and inflammation, and may be prescribed if other treatments are not helping.  Raising (elevating) the knee while resting.  Doing exercises to help you maintain movement (physical therapy). These may be recommended after pain and swelling improve.  Having a procedure to remove fluid from the bursa. This may be done if other treatments are not helping.  Having surgery to remove the bursa. This may be done if you have a severe infection or if the condition keeps coming back after treatment. Follow these instructions at home: Medicines  Take over-the-counter and prescription medicines only as told by your health care provider.  If you were prescribed an antibiotic medicine, take it as told by your health care provider. Do not stop taking the antibiotic even if you start to feel better. Managing pain, stiffness, and swelling   If directed, put ice on the injured area. ? Put ice in a plastic bag. ? Place a towel between your skin and the bag. ? Leave the ice on for 20 minutes, 2-3 times a day.  Elevate the injured area above the level of your heart while you are sitting or lying down. Activity  Do not use the injured limb to support your body weight until your health care provider says that you can.  Rest your knee.  Avoid activities that cause knee pain.  Return to your normal activities as told by your health care provider. Ask your health care provider what activities are safe for you.  Do exercises as   told by your health care provider. General instructions  Ask your health care provider when it is safe for you to drive.  Do not use any products that contain nicotine or tobacco, such as cigarettes, e-cigarettes, and chewing tobacco. These can delay healing. If you need help quitting, ask your health care provider.  Keep all follow-up visits as told by your health care provider. This is important. How is this prevented?  Warm up and stretch before being active.   Cool down and stretch after being active.  Give your body time to rest between periods of activity.  Maintain physical fitness, including strength and flexibility.  Be safe and responsible while being active. This will help you to avoid falls.  Wear knee pads if you have to kneel for a long period of time. Contact a health care provider if:  Your symptoms do not improve or get worse.  Your symptoms keep coming back after treatment.  You develop a fever and have warmth, redness, or swelling over your knee. Summary  Prepatellar bursitis is inflammation of the prepatellar bursa, which is a fluid-filled sac that cushions the kneecap (patella).  This condition may be caused by injury or constant pressure on the knee. It may also be caused by an infection from bacteria.  Symptoms of this condition include pain, swelling, warmth, and tenderness in the knee.  Follow instructions from your health care provider about taking medicines, resting, and doing activities.  Contact your health care provider if your symptoms do not improve, get worse, or keep coming back after treatment. This information is not intended to replace advice given to you by your health care provider. Make sure you discuss any questions you have with your health care provider. Document Released: 03/10/2005 Document Revised: 07/02/2018 Document Reviewed: 05/20/2018 Elsevier Patient Education  2020 [employer]  

## 2019-03-30 ENCOUNTER — Other Ambulatory Visit: Payer: Self-pay

## 2019-03-30 ENCOUNTER — Ambulatory Visit: Payer: No Typology Code available for payment source | Attending: Internal Medicine

## 2019-03-30 DIAGNOSIS — Z20822 Contact with and (suspected) exposure to covid-19: Secondary | ICD-10-CM

## 2019-03-31 ENCOUNTER — Telehealth: Payer: Self-pay

## 2019-03-31 LAB — NOVEL CORONAVIRUS, NAA: SARS-CoV-2, NAA: DETECTED — AB

## 2019-03-31 NOTE — Telephone Encounter (Signed)
Patient called in requesting COVID19 lab results  - DOB/Address verified - Confirmed Positive results given. Reviewed Patient Positive test results and MyChart Companion Algorithm for Home Monitoring of COVID19 with patient/spouse.  Patient reports the following symptoms: headache, fatigue and runny nose.   Patient/spouse verbalized understanding of instructions, no further questions.

## 2019-04-25 ENCOUNTER — Encounter: Payer: Self-pay | Admitting: Family Medicine

## 2019-06-30 ENCOUNTER — Encounter: Payer: Self-pay | Admitting: Family Medicine

## 2019-06-30 ENCOUNTER — Ambulatory Visit (INDEPENDENT_AMBULATORY_CARE_PROVIDER_SITE_OTHER): Payer: No Typology Code available for payment source | Admitting: Family Medicine

## 2019-06-30 ENCOUNTER — Other Ambulatory Visit: Payer: Self-pay

## 2019-06-30 VITALS — BP 138/82 | Temp 97.2°F | Wt 165.0 lb

## 2019-06-30 DIAGNOSIS — M5432 Sciatica, left side: Secondary | ICD-10-CM | POA: Diagnosis not present

## 2019-06-30 MED ORDER — PREDNISONE 20 MG PO TABS: ORAL_TABLET | ORAL | 0 refills | Status: DC

## 2019-06-30 NOTE — Progress Notes (Signed)
   Subjective:    Patient ID: Timothy Moody, male    DOB: 1985-07-23, 34 y.o.   MRN: [id number]  HPI Patient comes in today with complaints of left upper leg/hip pain. Patient states it has been bothering him for about 4 weeks now and is worse when sitting down or stretching, he does not recall doing anything that might have caused it.  wrks at tree work  Stays active an wroks hard  Worse in the eve time  Not painful at work  Worse when sitting for periods of time  Notes pain with forward clexion     Not much pain in the morn   No headache no chest pain no shortness of breath  Review of Systems     Objective:   Physical Exam  Alert vitals stable, NAD. Blood pressure good on repeat. HEENT normal. Lungs clear. Heart regular rate and rhythm. Plus minus straight leg raise on left.  Positive contralateral straight leg raise on right.  Some tenderness with deep palpation to left sciatic notch.      Assessment & Plan:  Impression probable sciatica.  Not classic presentation.  Forward neck flexion seems to aggravate as does straight leg raise positioning.  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  Will give protracted prednisone taper hopefully this will help calm things down.  If not follow-up

## 2019-07-21 ENCOUNTER — Other Ambulatory Visit: Payer: Self-pay

## 2019-07-21 ENCOUNTER — Encounter: Payer: Self-pay | Admitting: Family Medicine

## 2019-07-21 ENCOUNTER — Ambulatory Visit (INDEPENDENT_AMBULATORY_CARE_PROVIDER_SITE_OTHER): Payer: No Typology Code available for payment source | Admitting: Family Medicine

## 2019-07-21 VITALS — BP 120/82 | HR 78 | Temp 97.6°F | Ht 75.5 in | Wt 166.2 lb

## 2019-07-21 DIAGNOSIS — G5702 Lesion of sciatic nerve, left lower limb: Secondary | ICD-10-CM

## 2019-07-21 MED ORDER — DICLOFENAC SODIUM 75 MG PO TBEC: 75.0000 mg | DELAYED_RELEASE_TABLET | Freq: Two times a day (BID) | ORAL | 0 refills | Status: DC

## 2019-07-21 NOTE — Patient Instructions (Signed)
Piriformis Syndrome  Piriformis syndrome is a condition that can cause pain and numbness in your buttocks and down the back of your leg. Piriformis syndrome happens when the small muscle that connects the base of your spine to your hip (piriformis muscle) presses on the nerve that runs down the back of your leg (sciatic nerve). The piriformis muscle helps your hip rotate and helps to bring your leg back and out. It also helps shift your weight to keep you stable while you are walking. The sciatic nerve runs under or through the piriformis muscle. Damage to the piriformis muscle can cause spasms that put pressure on the nerve below. This causes pain and discomfort while sitting and moving. The pain may feel as if it begins in the buttock and spreads (radiates) down your hip and thigh. What are the causes? This condition is caused by pressure on the sciatic nerve from the piriformis muscle. The piriformis muscle can get irritated with overuse, especially if other hip muscles are weak and the piriformis muscle has to do extra work. Piriformis syndrome can also occur after an injury, like a fall onto your buttocks. What increases the risk? You are more likely to develop this condition if you:  Are a woman.  Sit for long periods of time.  Are a cyclist.  Have weak buttocks muscles (gluteal muscles). What are the signs or symptoms? Symptoms of this condition include:  Pain, tingling, or numbness that starts in the buttock and runs down the back of your leg (sciatica).  Pain in the groin or thigh area. Your symptoms may get worse:  The longer you sit.  When you walk, run, or climb stairs.  When straining to have a bowel movement. How is this diagnosed? This condition is diagnosed based on your symptoms, medical history, and physical exam.  During the exam, your health care provider may: ? Move your leg into different positions to check for pain. ? Press on the muscles of your hip and  buttock to see if that increases your symptoms.  You may also have tests, including: ? Imaging tests such as X-rays, MRI, or ultrasound. ? Electromyogram (EMG). This test measures electrical signals sent by your nerves into the muscles. ? Nerve conduction study. This test measures how well electrical signals pass through your nerves. How is this treated? This condition may be treated by:  Stopping all activities that cause pain or make your condition worse.  Applying ice or using heat therapy.  Taking medicines to reduce pain and swelling.  Taking a muscle relaxer (muscle relaxant) to stop muscle spasms.  Doing range-of-motion and strengthening exercises (physical therapy) as told by your health care provider.  Massaging the area.  Having acupuncture.  Getting an injection of medicine in the piriformis muscle. Your health care provider will choose the medicine based on your condition. He or she may inject: ? An anti-inflammatory medicine (steroid) to reduce swelling. ? A numbing medicine (local anesthetic) to block the pain. ? Botulinum toxin. The toxin blocks nerve impulses to specific muscles to reduce muscle tension. In rare cases, you may need surgery to cut the muscle and release pressure on the nerve if other treatments do not work. Follow these instructions at home: Activity  Do not sit for long periods. Get up and walk around every 20 minutes or as often as told by your health care provider. ? When driving long distances, make sure to take frequent stops to get up and stretch.  Use a   cushion when you sit on hard surfaces.  Do exercises as told by your health care provider.  Return to your normal activities as told by your health care provider. Ask your health care provider what activities are safe for you. Managing pain, stiffness, and swelling      If directed, apply heat to the affected area as often as told by your health care provider. Use the heat source that  your health care provider recommends, such as a moist heat pack or a heating pad. ? Place a towel between your skin and the heat source. ? Leave the heat on for 20-30 minutes. ? Remove the heat if your skin turns bright red. This is especially important if you are unable to feel pain, heat, or cold. You may have a greater risk of getting burned.  If directed, put ice on the injured area. ? Put ice in a plastic bag. ? Place a towel between your skin and the bag. ? Leave the ice on for 20 minutes, 2-3 times a day. General instructions  Take over-the-counter and prescription medicines only as told by your health care provider.  Ask your health care provider if the medicine prescribed to you requires you to avoid driving or using heavy machinery.  You may need to take actions to prevent or treat constipation, such as: ? Drink enough fluid to keep your urine pale yellow. ? Take over-the-counter or prescription medicines. ? Eat foods that are high in fiber, such as beans, whole grains, and fresh fruits and vegetables. ? Limit foods that are high in fat and processed sugars, such as fried or sweet foods.  Keep all follow-up visits as told by your health care provider. This is important. How is this prevented?  Do not sit for longer than 20 minutes at a time. When you sit, choose padded surfaces.  Warm up and stretch before being active.  Cool down and stretch after being active.  Give your body time to rest between periods of activity.  Make sure to use equipment that fits you.  Maintain physical fitness, including: ? Strength. ? Flexibility. Contact a health care provider if:  Your pain and stiffness continue or get worse.  Your leg or hip becomes weak.  You have changes in your bowel function or bladder function. Summary  Piriformis syndrome is a condition that can cause pain, tingling, and numbness in your buttocks and down the back of your leg.  You may try applying heat  or ice to relieve the pain.  Do not sit for long periods. Get up and walk around every 20 minutes or as often as told by your health care provider. This information is not intended to replace advice given to you by your health care provider. Make sure you discuss any questions you have with your health care provider. Document Revised: 07/01/2018 Document Reviewed: 11/04/2017 Elsevier Patient Education  2020 [employer] Inc.  

## 2019-07-21 NOTE — Progress Notes (Signed)
Patient ID: Timothy Moody, male    DOB: 1985-12-09, 34 y.o.   MRN: [id number]   Chief Complaint  Patient presents with  . Sciatica    follow up   Subjective:   HPI  HPI Patient was seen recently on 06/30/19 with left sided sciatica and given prednisone taper. Patient states it got better while on prednisone but returned when he finished prednisone. Hasn't taken any other meds for this pain. Pt stating trying to do some stretches at home.  Worsening pain in the left buttock and left leg when flexion on spine and flexion of neck. No trauma or injury to back or leg.  No previous surgeries on back.  Denies saddle anesthesia or bowel/bladder incontinence. No problems with sleep or pain in back in AM. Worse when getting home from work and sitting or driving long periods of time.   Medical History Etan has no past medical history on file.   Outpatient Encounter Medications as of 07/21/2019  Medication Sig  . diclofenac (VOLTAREN) 75 MG EC tablet Take 1 tablet (75 mg total) by mouth 2 (two) times daily.  . [DISCONTINUED] predniSONE (DELTASONE) 20 MG tablet Take 3 tablets per day for 4 days then 2 tablets per day for 3 days then 1 tablet per day for 3 days.   No facility-administered encounter medications on file as of 07/21/2019.     Review of Systems  Constitutional: Negative for chills and fever.  HENT: Negative for congestion, rhinorrhea and sore throat.   Respiratory: Negative for cough, shortness of breath and wheezing.   Cardiovascular: Negative for chest pain and leg swelling.  Gastrointestinal: Negative for abdominal pain, diarrhea, nausea and vomiting.  Genitourinary: Negative for dysuria and frequency.  Musculoskeletal: Positive for back pain (left buttock pain). Negative for arthralgias, gait problem, neck pain and neck stiffness.  Skin: Negative for rash.  Neurological: Negative for dizziness, weakness, light-headedness, numbness and headaches.      Vitals BP 120/82   Pulse 78   Temp 97.6 F (36.4 C) (Oral)   Ht 6' 3.5" (1.918 m)   Wt 166 lb 3.2 oz (75.4 kg)   SpO2 98%   BMI 20.50 kg/m   Objective:   Physical Exam Constitutional:      General: He is not in acute distress.    Appearance: Normal appearance.  HENT:     Head: Normocephalic and atraumatic.  Musculoskeletal:        General: No swelling, tenderness, deformity or signs of injury. Normal range of motion.     Cervical back: Normal range of motion and neck supple.       Back:     Right lower leg: No edema.     Left lower leg: No edema.     Comments: Normal skin inspection-back.  Skin:    General: Skin is warm and dry.     Findings: No lesion or rash.  Neurological:     General: No focal deficit present.     Mental Status: He is alert and oriented to person, place, and time.     Cranial Nerves: No cranial nerve deficit.     Sensory: No sensory deficit.     Motor: No weakness.     Gait: Gait normal.      Assessment and Plan   1. Piriformis syndrome of left side - diclofenac (VOLTAREN) 75 MG EC tablet; Take 1 tablet (75 mg total) by mouth 2 (two) times daily.  Dispense: 60 tablet; Refill: 0 -  Ambulatory referral to Physical Therapy    Advising diclofenac for next 3-4 wks with food. ordered PT for piriformis syndrome. Gave exercises to do at home and instructions on piriformis syndrome.  Heating pad 3x per day 28mins each.  May need imaging if not improving.  Pt in agreement with plan.   F/u if worsening or prn.

## 2019-08-02 ENCOUNTER — Encounter (HOSPITAL_COMMUNITY): Payer: Self-pay | Admitting: Physical Therapy

## 2019-08-02 ENCOUNTER — Other Ambulatory Visit: Payer: Self-pay

## 2019-08-02 ENCOUNTER — Ambulatory Visit (HOSPITAL_COMMUNITY): Payer: No Typology Code available for payment source | Attending: Family Medicine | Admitting: Physical Therapy

## 2019-08-02 DIAGNOSIS — M545 Low back pain, unspecified: Secondary | ICD-10-CM

## 2019-08-02 DIAGNOSIS — M25552 Pain in left hip: Secondary | ICD-10-CM | POA: Insufficient documentation

## 2019-08-02 NOTE — Therapy (Signed)
[place], [place], [address] Phone: [phone]   Fax:  [phone]  Physical Therapy Treatment  Patient Details  Name: Timothy Moody MRN: [id number] Date of Birth: Nov 23, 1985 Referring Provider (PT): Malena Lovena Le   Encounter Date: 08/02/2019  PT End of Session - 08/02/19 1549    Visit Number  1    Number of Visits  8    Date for PT Re-Evaluation  09/27/19   eval 08/02/19   Authorization Type  [employer], no ded, no auth    Progress Note Due on Visit  10    PT Start Time  1500    PT Stop Time  1545    PT Time Calculation (min)  45 min    Activity Tolerance  Patient tolerated treatment well    Behavior During Therapy  [facility] for tasks assessed/performed       History reviewed. No pertinent past medical history.  History reviewed. No pertinent surgical history.  There were no vitals filed for this visit.  Subjective Assessment - 08/02/19 1550    Subjective  Patient complains of buttocks/leg pain in left leg that started about 3 months ago. Sitting and driving make it worse with it reaching 9/10 pain shooting down his leg. States walking and standing is fine. He feels it more when he bends forward and when he flexes his neck. Reports he is pretty active with his job and kids but doesn't regularly work out. States current pain is 1/10. Pain is worse with transitional movements.    Limitations  Sitting    Currently in Pain?  Yes    Pain Score  1     Pain Location  Hip    Pain Orientation  Left;Posterior    Pain Descriptors / Indicators  Aching    Pain Type  Acute pain    Pain Radiating Towards  down leg         [facility] PT Assessment - 08/02/19 0001      Assessment   Medical Diagnosis  left piriformis syndrome    Referring Provider (PT)  Malena Taylor      Precautions   Precautions  None      Balance Screen   Has the patient fallen in the past 6 months  No      [place] residence    Available Help at Discharge  Family    Type of [place]      Prior Function   Level of Independence  Independent    Vocation  Full time employment    Vocation Requirements  cuts down trees      Cognition   Overall Cognitive Status  Within Functional Limits for tasks assessed      Observation/Other Assessments   Observations  sacral sitting, slumped sitting noted     Focus on Therapeutic Outcomes (FOTO)   22% limited       ROM / Strength   AROM / PROM / Strength  AROM;Strength      AROM   AROM Assessment Site  Lumbar    Lumbar Flexion  0% limited   pain in low back/hip coming back up    Lumbar Extension  50% limited     Lumbar - Right Side Bend  50% limited   pain on left side   Lumbar - Left Side Bend  50% limited     Lumbar - Right  Rotation  25% limited    Lumbar - Left Rotation  25% limited      Strength   Strength Assessment Site  Hip    Right/Left Hip  Right;Left    Right Hip Extension  4+/5    Left Hip Extension  4+/5      Flexibility   Soft Tissue Assessment /Muscle Length  yes    Hamstrings  --   90/90 test - 30 degrees from full ext B, no symptom increase     Palpation   Spinal mobility  hypomobility in lumbar spine noted - no reproductions of symptoms    Palpation comment  tenderness in left glute med, not in piriformis.       Special Tests   Other special tests  neg SLR B, neg ely's test B      Transfers   Comments  transitioning from supine to sit and stand to sit patient presented with pain in hip.                    [facility] Adult PT Treatment/Exercise - 08/02/19 0001      Exercises   Exercises  Knee/Hip      Knee/Hip Exercises: Standing   Other Standing Knee Exercises  self mobilization with tennis ball to low back at wall     Other Standing Knee Exercises  towel roll across lumbar spine - no change - 1/2 foam across low back  2-3 minutes at L5       Knee/Hip Exercises: Sidelying   Other  Sidelying Knee/Hip Exercises  book stretch x10 10" holds B             PT Education - 08/02/19 1544    Education Details  on current condition, on presentation, on HEP and rationale for exercises.    Person(s) Educated  Patient    Methods  Explanation;Handout    Comprehension  Verbalized understanding       PT Short Term Goals - 08/02/19 1548      PT SHORT TERM GOAL #1   Title  Patient will be independent in self management strategies to improve quality of life and functional outcomes.    Time  4    Period  Weeks    Status  New    Target Date  08/30/19      PT SHORT TERM GOAL #2   Title  Patient will report at least 50% improvement in overall symptoms and/or functional ability.    Time  4    Period  Weeks    Status  New    Target Date  08/30/19        PT Long Term Goals - 08/02/19 1548      PT LONG TERM GOAL #1   Title  Patient will report at least 75% improvement in overall symptoms and/or functional ability.    Time  8    Period  Weeks    Status  New    Target Date  09/27/19      PT LONG TERM GOAL #2   Title  Patient will be able to demonstrate painfree lumbar mobility to improve gross mobility    Time  8    Period  Weeks    Status  New    Target Date  09/27/19            Plan - 08/02/19 1534    Clinical Impression Statement  Patient presents to therapy with left buttocks/hip pain  that radiates down leg. Symptoms note consistent with true piriformis syndrome secondary to no tenderness or tightness noted in any hip musculature. SLR and slump test negative but symptoms to seem to have fascial component secondary to increased pain with cervical flexion. Decreased symptoms noted after lumbar extension mobilization over foam roller. Educated patient on current condition and plan moving forward. Patient would greatly benefit from skilled physical therapy to improve functional mobility and decrease risk of further injury.    Personal Factors and Comorbidities   Age    Examination-Activity Limitations  Sit;Transfers    Examination-Participation Restrictions  Driving    Stability/Clinical Decision Making  Stable/Uncomplicated    Clinical Decision Making  Low    Rehab Potential  Good    PT Frequency  --   1-2x/week for total of 8 visits over 8 week certification   PT Duration  8 weeks    PT Treatment/Interventions  ADLs/Self Care Home Management;Aquatic Therapy;Cryotherapy;Electrical Stimulation;Moist Heat;Traction;Iontophoresis 4mg /ml Dexamethasone;Therapeutic exercise;Therapeutic activities;Functional mobility training;Neuromuscular re-education;Patient/family education;Manual techniques;Passive range of motion;Dry needling    PT Next Visit Plan  lumbar mobility, lumbar mobilizations, fascial mobilization/stretches    PT Home Exercise Plan  5/11 self mobilization with tennis ball, book stretch (lumbar rotation), lumbar extension over towel roll    Consulted and Agree with Plan of Care  Patient       Patient will benefit from skilled therapeutic intervention in order to improve the following deficits and impairments:  Pain, Postural dysfunction, Decreased mobility  Visit Diagnosis: Pain in left hip  Lumbar pain     Problem List There are no problems to display for this patient.  4:06 PM, 08/02/19 10/02/19, DPT Physical Therapy with [facility]  [phone] office  [facility] [address] [place], [place], [place] Phone: [phone]   Fax:  [phone]  Name: Timothy Moody MRN: Germaine Pomfret Date of Birth: 09-03-1985

## 2019-08-02 NOTE — Patient Instructions (Signed)
Access Code: [email or web address] URL: [email or web address] Date: 08/02/2019 Prepared by: Revonda Humphrey  Exercises Sidelying Thoracic Lumbar Rotation - 1 sets - 10 reps - 5 hold Self Release Lumbar extension over 1/2 foam

## 2019-08-12 ENCOUNTER — Other Ambulatory Visit: Payer: Self-pay

## 2019-08-12 ENCOUNTER — Ambulatory Visit (HOSPITAL_COMMUNITY): Payer: No Typology Code available for payment source | Admitting: Physical Therapy

## 2019-08-12 ENCOUNTER — Encounter (HOSPITAL_COMMUNITY): Payer: Self-pay | Admitting: Physical Therapy

## 2019-08-12 DIAGNOSIS — M25552 Pain in left hip: Secondary | ICD-10-CM

## 2019-08-12 DIAGNOSIS — M545 Low back pain, unspecified: Secondary | ICD-10-CM

## 2019-08-12 NOTE — Patient Instructions (Signed)
Access Code: [email or web address] URL: [email or web address] Date: 08/12/2019 Prepared by: Georges Lynch  Exercises Prone Press Up - 3 x daily - 7 x weekly - 2 sets - 10 reps Supine Sciatic Nerve Glide - 3 x daily - 7 x weekly - 2 sets - 10 reps Supine Bridge - 3 x daily - 7 x weekly - 2 sets - 10 reps - 5 hold

## 2019-08-12 NOTE — Therapy (Signed)
[facility] [address] [place], [place], [address] Phone: [phone]   Fax:  [phone]  Physical Therapy Treatment  Patient Details  Name: Timothy Moody MRN: [id number] Date of Birth: 06-Aug-1985 Referring Provider (PT): Malena Ladona Ridgel   Encounter Date: 08/12/2019  PT End of Session - 08/12/19 1315    Visit Number  2    Number of Visits  8    Date for PT Re-Evaluation  09/27/19   eval 08/02/19   Authorization Type  [employer], no ded, no auth    Progress Note Due on Visit  10    PT Start Time  1304    PT Stop Time  1344    PT Time Calculation (min)  40 min    Activity Tolerance  Patient tolerated treatment well    Behavior During Therapy  [facility] for tasks assessed/performed       History reviewed. No pertinent past medical history.  History reviewed. No pertinent surgical history.  There were no vitals filed for this visit.  Subjective Assessment - 08/12/19 1314    Subjective  Patient says HEP exercises have been helpful but only for a brief period of time. Reports ongoing pain in LLE and posterior hip.    Limitations  Sitting    Currently in Pain?  Yes    Pain Score  2     Pain Location  Hip    Pain Orientation  Posterior;Left    Pain Descriptors / Indicators  Aching;Sore    Pain Type  Acute pain    Pain Onset  More than a month ago    Pain Frequency  Intermittent                        [facility] Adult PT Treatment/Exercise - 08/12/19 0001      Exercises   Exercises  Lumbar      Lumbar Exercises: Stretches   Press Ups  2 reps;10 reps      Lumbar Exercises: Supine   Bridge  10 reps;5 seconds    Other Supine Lumbar Exercises  supine sciatic nerve glide LT x15             PT Education - 08/12/19 1341    Education Details  on further assessment findings, exercise technique and HEP handout    Person(s) Educated  Patient    Methods  Explanation;Handout    Comprehension  Verbalized  understanding       PT Short Term Goals - 08/02/19 1548      PT SHORT TERM GOAL #1   Title  Patient will be independent in self management strategies to improve quality of life and functional outcomes.    Time  4    Period  Weeks    Status  New    Target Date  08/30/19      PT SHORT TERM GOAL #2   Title  Patient will report at least 50% improvement in overall symptoms and/or functional ability.    Time  4    Period  Weeks    Status  New    Target Date  08/30/19        PT Long Term Goals - 08/02/19 1548      PT LONG TERM GOAL #1   Title  Patient will report at least 75% improvement in overall symptoms and/or functional ability.    Time  8    Period  Weeks  Status  New    Target Date  09/27/19      PT LONG TERM GOAL #2   Title  Patient will be able to demonstrate painfree lumbar mobility to improve gross mobility    Time  8    Period  Weeks    Status  New    Target Date  09/27/19            Plan - 08/12/19 1342    Clinical Impression Statement  Patient tolerated session well today. Further assessed lumbar mobility and response to repeated movement testing. Patient with no reported effect of repeated flexion and extend in standing. Patient did note mild cramping in LLE with initial reps of hip bridge. Performed sciatic nerve glides, and cramping resolved, patient able to perform full set. Educated patient on anatomy of lumbar spine and updated HEP handout including press ups, bridging and sciatic nerve glides. Patient educate on proper from and function of all added activity. Patient will continue to benefit from skilled therapy services to progress hip and core strength and lumbar mobility to reduce pain and improve LOF with ADLs.    Personal Factors and Comorbidities  Age    Examination-Activity Limitations  Sit;Transfers    Examination-Participation Restrictions  Driving    Stability/Clinical Decision Making  Stable/Uncomplicated    Rehab Potential  Good    PT  Frequency  --   1-2x/week for total of 8 visits over 8 week certification   PT Duration  8 weeks    PT Treatment/Interventions  ADLs/Self Care Home Management;Aquatic Therapy;Cryotherapy;Electrical Stimulation;Moist Heat;Traction;Iontophoresis 4mg /ml Dexamethasone;Therapeutic exercise;Therapeutic activities;Functional mobility training;Neuromuscular re-education;Patient/family education;Manual techniques;Passive range of motion;Dry needling    PT Next Visit Plan  lumbar mobility, lumbar mobilizations, fascial mobilization/stretches    PT Home Exercise Plan  5/11 self mobilization with tennis ball, book stretch (lumbar rotation), lumbar extension over towel roll; 08/12/19: bridge, sciatic nerve glide, prone press up    Consulted and Agree with Plan of Care  Patient       Patient will benefit from skilled therapeutic intervention in order to improve the following deficits and impairments:  Pain, Postural dysfunction, Decreased mobility  Visit Diagnosis: Pain in left hip  Lumbar pain     Problem List There are no problems to display for this patient.  1:48 PM, 08/12/19 Josue Hector PT DPT  Physical Therapist with [facility]  [phone] [place] [address] [place], [place], [address] Phone: [phone]   Fax:  [phone]  Name: Timothy Moody MRN: [id number] Date of Birth: 1986-01-03

## 2019-08-15 ENCOUNTER — Telehealth (HOSPITAL_COMMUNITY): Payer: Self-pay

## 2019-08-15 ENCOUNTER — Ambulatory Visit (HOSPITAL_COMMUNITY): Payer: No Typology Code available for payment source

## 2019-08-15 NOTE — Telephone Encounter (Signed)
pt said there is no way he can make his appt for today, so he cancelled

## 2019-08-18 ENCOUNTER — Encounter (HOSPITAL_COMMUNITY): Payer: Self-pay | Admitting: Physical Therapy

## 2019-08-18 ENCOUNTER — Other Ambulatory Visit: Payer: Self-pay

## 2019-08-18 ENCOUNTER — Ambulatory Visit (HOSPITAL_COMMUNITY): Payer: No Typology Code available for payment source | Admitting: Physical Therapy

## 2019-08-18 DIAGNOSIS — M545 Low back pain, unspecified: Secondary | ICD-10-CM

## 2019-08-18 DIAGNOSIS — M25552 Pain in left hip: Secondary | ICD-10-CM | POA: Diagnosis not present

## 2019-08-18 NOTE — Patient Instructions (Signed)
Access Code: [email or web address] URL: [email or web address] Date: 08/18/2019 Prepared by: Greig Castilla Remi Lopata  Exercises Prone Hip Extension - 1 x daily - 7 x weekly - 2 sets - 10 reps

## 2019-08-18 NOTE — Therapy (Signed)
[facility] [address] [place], [place], [address] Phone: [phone]   Fax:  [phone]  Physical Therapy Treatment  Patient Details  Name: Timothy Moody MRN: [id number] Date of Birth: 1985-11-29 Referring Provider (PT): Malena Ladona Ridgel   Encounter Date: 08/18/2019  PT End of Session - 08/18/19 1557    Visit Number  3    Number of Visits  8    Date for PT Re-Evaluation  09/27/19   eval 08/02/19   Authorization Type  [place] [employer], no ded, no auth    Progress Note Due on Visit  10    PT Start Time  1517    PT Stop Time  1555    PT Time Calculation (min)  38 min    Activity Tolerance  Patient tolerated treatment well    Behavior During Therapy  [facility] for tasks assessed/performed       History reviewed. No pertinent past medical history.  History reviewed. No pertinent surgical history.  There were no vitals filed for this visit.  Subjective Assessment - 08/18/19 1519    Subjective  Patient states exercises given to him last time were helpful. He is doing 2-3x/day. When his symptoms start bothering him he does the stretches. He has been using towel roll. He notices improvement as soon as he is done with exercises.    Limitations  Sitting    Currently in Pain?  No/denies    Pain Score  --   2-3 at it worst   Pain Location  Back   /glute   Pain Onset  More than a month ago                        [facility] Adult PT Treatment/Exercise - 08/18/19 0001      Lumbar Exercises: Stretches   Press Ups  2 reps;10 reps      Lumbar Exercises: Supine   Ab Set  10 reps    AB Set Limitations  5 second holds    Bent Knee Raise  20 reps    Bent Knee Raise Limitations  with ab set    Bridge  5 seconds;20 reps    Other Supine Lumbar Exercises  supine sciatic nerve glide LT x15      Lumbar Exercises: Sidelying   Hip Abduction  Both;10 reps    Hip Abduction Limitations  2 sets      Lumbar Exercises: Prone   Straight  Leg Raise  10 reps    Straight Leg Raises Limitations  2 sets    Other Prone Lumbar Exercises  prone heel squeeze             PT Education - 08/18/19 1556    Education Details  Review of HEP, continuing HEP, exercise mechanics, lumbar stabilizers, TA activation, core strength importance    Person(s) Educated  Patient    Methods  Demonstration;Explanation    Comprehension  Verbalized understanding;Returned demonstration       PT Short Term Goals - 08/02/19 1548      PT SHORT TERM GOAL #1   Title  Patient will be independent in self management strategies to improve quality of life and functional outcomes.    Time  4    Period  Weeks    Status  New    Target Date  08/30/19      PT SHORT TERM GOAL #2   Title  Patient will report at  least 50% improvement in overall symptoms and/or functional ability.    Time  4    Period  Weeks    Status  New    Target Date  08/30/19        PT Long Term Goals - 08/02/19 1548      PT LONG TERM GOAL #1   Title  Patient will report at least 75% improvement in overall symptoms and/or functional ability.    Time  8    Period  Weeks    Status  New    Target Date  09/27/19      PT LONG TERM GOAL #2   Title  Patient will be able to demonstrate painfree lumbar mobility to improve gross mobility    Time  8    Period  Weeks    Status  New    Target Date  09/27/19            Plan - 08/18/19 1527    Clinical Impression Statement  Patient appears to be responding well to extension based exercises. He tolerates glute strengthen exercises with mild discomfort in low back but not pain. He requires some verbal cueing to limit rotation of low back with hip extension in prone. He has difficulty with TA activation despite frequent verbal and tactile cueing. He fatigues quickly with hip abduction exercise. Patient will benefit from skilled physical therapy in order to improve function and reduce impairment.    Personal Factors and Comorbidities   Age    Examination-Activity Limitations  Sit;Transfers    Examination-Participation Restrictions  Driving    Stability/Clinical Decision Making  Stable/Uncomplicated    Rehab Potential  Good    PT Frequency  --   1-2x/week for total of 8 visits over 8 week certification   PT Duration  8 weeks    PT Treatment/Interventions  ADLs/Self Care Home Management;Aquatic Therapy;Cryotherapy;Electrical Stimulation;Moist Heat;Traction;Iontophoresis 4mg /ml Dexamethasone;Therapeutic exercise;Therapeutic activities;Functional mobility training;Neuromuscular re-education;Patient/family education;Manual techniques;Passive range of motion;Dry needling    PT Next Visit Plan  lumbar mobility, lumbar mobilizations, fascial mobilization/stretches    PT Home Exercise Plan  5/11 self mobilization with tennis ball, book stretch (lumbar rotation), lumbar extension over towel roll; 08/12/19: bridge, sciatic nerve glide, prone press up 08/18/19 hip extension    Consulted and Agree with Plan of Care  Patient       Patient will benefit from skilled therapeutic intervention in order to improve the following deficits and impairments:  Pain, Postural dysfunction, Decreased mobility  Visit Diagnosis: Pain in left hip  Lumbar pain     Problem List There are no problems to display for this patient.   3:59 PM, 08/18/19 Mearl Latin PT, DPT Physical Therapist at [place], [place], [address] Phone: [phone]   Fax:  [phone]  Name: Timothy Moody MRN: [id number] Date of Birth: Aug 22, 1985

## 2019-08-23 ENCOUNTER — Other Ambulatory Visit: Payer: Self-pay

## 2019-08-23 ENCOUNTER — Ambulatory Visit (HOSPITAL_COMMUNITY): Payer: No Typology Code available for payment source | Attending: Family Medicine | Admitting: Physical Therapy

## 2019-08-23 DIAGNOSIS — M545 Low back pain, unspecified: Secondary | ICD-10-CM

## 2019-08-23 DIAGNOSIS — M25552 Pain in left hip: Secondary | ICD-10-CM | POA: Diagnosis present

## 2019-08-23 NOTE — Therapy (Signed)
[place], [place], [address] Phone: [phone]   Fax:  [phone]  Physical Therapy Treatment  Patient Details  Name: Timothy Moody MRN: [id number] Date of Birth: 1985-07-30 Referring Provider (PT): Malena Lovena Le   Encounter Date: 08/23/2019  PT End of Session - 08/23/19 1703    Visit Number  4    Number of Visits  8    Date for PT Re-Evaluation  09/27/19   eval 08/02/19   Authorization Type  [place] [employer], no ded, no auth    Progress Note Due on Visit  10    PT Start Time  1616    PT Stop Time  1700    PT Time Calculation (min)  44 min    Activity Tolerance  Patient tolerated treatment well    Behavior During Therapy  [facility] for tasks assessed/performed       No past medical history on file.  No past surgical history on file.  There were no vitals filed for this visit.  Subjective Assessment - 08/23/19 1634    Subjective  Pt states he has more pain in seated position and varies 2-5.  STates it is worse in the evenings when he's trying to relax.    Currently in Pain?  No/denies                        [facility] Adult PT Treatment/Exercise - 08/23/19 0001      Lumbar Exercises: Stretches   Active Hamstring Stretch  2 reps;30 seconds;Right;Left    Active Hamstring Stretch Limitations  long sitting in good posturing    Press Ups  2 reps;10 reps    Piriformis Stretch  Right;Left;2 reps;30 seconds;Limitations    Piriformis Stretch Limitations  seated      Lumbar Exercises: Standing   Scapular Retraction  Both;10 reps;Theraband    Theraband Level (Scapular Retraction)  Level 3 (Green)    Row  Both;10 reps;Theraband    Theraband Level (Row)  Level 3 (Green)    Shoulder Extension  Both;10 reps;Theraband    Theraband Level (Shoulder Extension)  Level 3 (Green)      Lumbar Exercises: Supine   Ab Set  10 reps    AB Set Limitations  5 second holds    Bent Knee Raise  20 reps    Bent  Knee Raise Limitations  with ab set    Bridge  5 seconds;20 reps    Other Supine Lumbar Exercises  supine sciatic nerve glide LT x15      Lumbar Exercises: Sidelying   Hip Abduction  Both;10 reps;Limitations    Hip Abduction Limitations  2 sets      Lumbar Exercises: Prone   Straight Leg Raise  10 reps    Straight Leg Raises Limitations  2 sets    Other Prone Lumbar Exercises  prone heel squeeze             PT Education - 08/23/19 1708    Education Details  importance of upright posturing, not "slouching".  How piriformis and hamstring tightness can impact the back    Person(s) Educated  Patient    Methods  Explanation    Comprehension  Verbalized understanding       PT Short Term Goals - 08/02/19 1548      PT SHORT TERM GOAL #1   Title  Patient will be independent in self management strategies to improve quality  of life and functional outcomes.    Time  4    Period  Weeks    Status  New    Target Date  08/30/19      PT SHORT TERM GOAL #2   Title  Patient will report at least 50% improvement in overall symptoms and/or functional ability.    Time  4    Period  Weeks    Status  New    Target Date  08/30/19        PT Long Term Goals - 08/02/19 1548      PT LONG TERM GOAL #1   Title  Patient will report at least 75% improvement in overall symptoms and/or functional ability.    Time  8    Period  Weeks    Status  New    Target Date  09/27/19      PT LONG TERM GOAL #2   Title  Patient will be able to demonstrate painfree lumbar mobility to improve gross mobility    Time  8    Period  Weeks    Status  New    Target Date  09/27/19            Plan - 08/23/19 1705    Clinical Impression Statement  continues with intermittent sciatic pain in Lt glute and into LE at times.  STates extension exercises have really helped to control and reduce symptoms.    Added seated piriformis and hamstring stretches this session with postural cues provided.  Noted to  easily get out of upright posturing and slouched so discussed the importance of this with patient and he admits to doing better and using a towel roll to increase comfort.  Postural theraband strengthening also added this session with cues for form and abdominal stabilization.  Pt reported overall improvement at end of session.    Personal Factors and Comorbidities  Age    Examination-Activity Limitations  Sit;Transfers    Examination-Participation Restrictions  Driving    Stability/Clinical Decision Making  Stable/Uncomplicated    Rehab Potential  Good    PT Frequency  --   1-2x/week for total of 8 visits over 8 week certification   PT Duration  8 weeks    PT Treatment/Interventions  ADLs/Self Care Home Management;Aquatic Therapy;Cryotherapy;Electrical Stimulation;Moist Heat;Traction;Iontophoresis 4mg /ml Dexamethasone;Therapeutic exercise;Therapeutic activities;Functional mobility training;Neuromuscular re-education;Patient/family education;Manual techniques;Passive range of motion;Dry needling    PT Next Visit Plan  lumbar mobility, lumbar mobilizations, fascial mobilization/stretches.  Continue to improve core strength as well, extension focus.    PT Home Exercise Plan  5/11 self mobilization with tennis ball, book stretch (lumbar rotation), lumbar extension over towel roll; 08/12/19: bridge, sciatic nerve glide, prone press up 08/18/19 hip extension    Consulted and Agree with Plan of Care  Patient       Patient will benefit from skilled therapeutic intervention in order to improve the following deficits and impairments:  Pain, Postural dysfunction, Decreased mobility  Visit Diagnosis: Lumbar pain  Pain in left hip     Problem List There are no problems to display for this patient.  08/20/19, PTA/CLT [phone]   [phone] 08/23/2019, 5:12 PM  [facility] [address] [place], [place], [place] Phone: [phone]   Fax:   [phone]  Name: RANULFO KALL MRN: Germaine Pomfret Date of Birth: 07/13/1985

## 2019-08-25 ENCOUNTER — Encounter (HOSPITAL_COMMUNITY): Payer: No Typology Code available for payment source | Admitting: Physical Therapy

## 2019-08-25 ENCOUNTER — Telehealth (HOSPITAL_COMMUNITY): Payer: Self-pay | Admitting: Physical Therapy

## 2019-08-25 NOTE — Telephone Encounter (Signed)
pt cancelled appt for today, no reason given 

## 2019-08-29 ENCOUNTER — Other Ambulatory Visit: Payer: Self-pay

## 2019-08-29 ENCOUNTER — Encounter (HOSPITAL_COMMUNITY): Payer: Self-pay | Admitting: Physical Therapy

## 2019-08-29 ENCOUNTER — Ambulatory Visit (HOSPITAL_COMMUNITY): Payer: No Typology Code available for payment source | Admitting: Physical Therapy

## 2019-08-29 DIAGNOSIS — M25552 Pain in left hip: Secondary | ICD-10-CM

## 2019-08-29 DIAGNOSIS — M545 Low back pain, unspecified: Secondary | ICD-10-CM

## 2019-08-29 NOTE — Therapy (Signed)
[facility] [address] [place], [place], [address] Phone: [phone]   Fax:  [phone]  Physical Therapy Treatment  Patient Details  Name: Timothy Moody MRN: [id number] Date of Birth: March 02, 1986 Referring Provider (PT): Malena Ladona Ridgel   Encounter Date: 08/29/2019  PT End of Session - 08/29/19 1537    Visit Number  5    Number of Visits  8    Date for PT Re-Evaluation  09/27/19   eval 08/02/19   Authorization Type  [facility] [employer], no ded, no auth    Progress Note Due on Visit  10    PT Start Time  1537   pt late to session   PT Stop Time  1611    PT Time Calculation (min)  34 min    Activity Tolerance  Patient tolerated treatment well    Behavior During Therapy  [facility] for tasks assessed/performed       History reviewed. No pertinent past medical history.  History reviewed. No pertinent surgical history.  There were no vitals filed for this visit.  Subjective Assessment - 08/29/19 1547    Subjective  States that he does stretches and the pain resolves for a couple hours but then it come back. States he tries to do the standing ones throughout the day at work. Nerve glides and laying over the towel really help.    Currently in Pain?  Yes    Pain Score  2     Pain Location  Back         [facility] PT Assessment - 08/29/19 0001      Assessment   Medical Diagnosis  left piriformis syndrome    Referring Provider (PT)  Malena Meridee Score Adult PT Treatment/Exercise - 08/29/19 0001      Therapeutic Activites    Therapeutic Activities  Lifting    Lifting  Box wiht 10# deep lift and medium height lift - 20 minutes total       Lumbar Exercises: Stretches   Other Lumbar Stretch Exercise  lumbar extension x20 standing - throughout session       Lumbar Exercises: Standing   Other Standing Lumbar Exercises  hip hinges - x10 with dowel then  4x10 no dowel      Lumbar Exercises: Seated   Other  Seated Lumbar Exercises  hip hinge with dowel x25 --> no dowel x25               PT Short Term Goals - 08/02/19 1548      PT SHORT TERM GOAL #1   Title  Patient will be independent in self management strategies to improve quality of life and functional outcomes.    Time  4    Period  Weeks    Status  New    Target Date  08/30/19      PT SHORT TERM GOAL #2   Title  Patient will report at least 50% improvement in overall symptoms and/or functional ability.    Time  4    Period  Weeks    Status  New    Target Date  08/30/19        PT Long Term Goals - 08/02/19 1548      PT LONG TERM GOAL #1   Title  Patient will report at least 75% improvement in overall symptoms and/or functional  ability.    Time  8    Period  Weeks    Status  New    Target Date  09/27/19      PT LONG TERM GOAL #2   Title  Patient will be able to demonstrate painfree lumbar mobility to improve gross mobility    Time  8    Period  Weeks    Status  New    Target Date  09/27/19            Plan - 08/29/19 1613    Clinical Impression Statement  Focused on functional movements and learning proper hip hinge movement. Patient picked up movement quickly and able to transition to functional lifts and bending forward with hip hinge in seated position to improve ability to get socks and shoes on. Performed lumbar extension exercise throughout session between sets. No pain noted end of session or when hip hinge movement performed correctly.    Personal Factors and Comorbidities  Age    Examination-Activity Limitations  Sit;Transfers    Examination-Participation Restrictions  Driving    Stability/Clinical Decision Making  Stable/Uncomplicated    Rehab Potential  Good    PT Frequency  --   1-2x/week for total of 8 visits over 8 week certification   PT Duration  8 weeks    PT Treatment/Interventions  ADLs/Self Care Home Management;Aquatic Therapy;Cryotherapy;Electrical Stimulation;Moist  Heat;Traction;Iontophoresis 4mg /ml Dexamethasone;Therapeutic exercise;Therapeutic activities;Functional mobility training;Neuromuscular re-education;Patient/family education;Manual techniques;Passive range of motion;Dry needling    PT Next Visit Plan  f/u with hip hinge movement, lumbar mobility, lumbar mobilizations, fascial mobilization/stretches.  Continue to improve core strength as well, extension focus.    PT Home Exercise Plan  5/11 self mobilization with tennis ball, book stretch (lumbar rotation), lumbar extension over towel roll; 08/12/19: bridge, sciatic nerve glide, prone press up 08/18/19 hip extension; 6/7 hip hinge in setaed adn standing    Consulted and Agree with Plan of Care  Patient       Patient will benefit from skilled therapeutic intervention in order to improve the following deficits and impairments:  Pain, Postural dysfunction, Decreased mobility  Visit Diagnosis: Lumbar pain  Pain in left hip     Problem List There are no problems to display for this patient.  4:14 PM, 08/29/19 Jerene Pitch, DPT Physical Therapy with [facility]  [phone] office  [place] [address] [place], [place], [address] Phone: [phone]   Fax:  [phone]  Name: CORD WILCZYNSKI MRN: [id number] Date of Birth: [birth year]

## 2019-09-01 ENCOUNTER — Ambulatory Visit (HOSPITAL_COMMUNITY): Payer: No Typology Code available for payment source | Admitting: Physical Therapy

## 2019-09-01 ENCOUNTER — Other Ambulatory Visit: Payer: Self-pay

## 2019-09-01 ENCOUNTER — Encounter (HOSPITAL_COMMUNITY): Payer: Self-pay | Admitting: Physical Therapy

## 2019-09-01 DIAGNOSIS — M545 Low back pain, unspecified: Secondary | ICD-10-CM

## 2019-09-01 DIAGNOSIS — M25552 Pain in left hip: Secondary | ICD-10-CM

## 2019-09-01 NOTE — Therapy (Signed)
[facility] [address] [place], [place], [address] Phone: [phone]   Fax:  [phone]  Physical Therapy Treatment  Patient Details  Name: ROSARIO KUSHNER MRN: [id number] Date of Birth: [birth year] Referring Provider (PT): Malena Ladona Ridgel   Encounter Date: 09/01/2019   PT End of Session - 09/01/19 1451    Visit Number 6    Number of Visits 8    Date for PT Re-Evaluation 09/27/19   eval 08/02/19   Authorization Type [place] [employer], no ded, no auth    Progress Note Due on Visit 10    PT Start Time 1450    PT Stop Time 1530    PT Time Calculation (min) 40 min    Activity Tolerance Patient tolerated treatment well    Behavior During Therapy [facility] for tasks assessed/performed           History reviewed. No pertinent past medical history.  History reviewed. No pertinent surgical history.  There were no vitals filed for this visit.   Subjective Assessment - 09/01/19 1458    Subjective States that he has been doing the exercise without difficulty and bending is getting easier. 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 States that he figured out using the lumbar support in the truck really helps.    Currently in Pain? Yes    Pain Score 2     Pain Location Back    Pain Orientation Posterior;Left    Pain Descriptors / Indicators Aching              [facility] PT Assessment - 09/01/19 0001      Assessment   Medical Diagnosis left piriformis syndrome    Referring Provider (PT) Malena Meridee Score Adult PT Treatment/Exercise - 09/01/19 0001      Lumbar Exercises: Seated   Other Seated Lumbar Exercises posture holds - seated anteiror pelvic tilts x20 5" holds       Lumbar Exercises: Supine   Bridge 10 reps   walkouts, 3 sets   Other Supine Lumbar Exercises TRA activation with SL leg lower from bent knee x20 B --> then DL W58       Lumbar Exercises: Prone   Other  Prone Lumbar Exercises supermans 5x5 2" holds                  PT Education - 09/01/19 1508    Education Details educated patient in posture, pelvic position and how this will help decrease pain in lumbar spine and stress on lumbar spine.    Person(s) Educated Patient    Methods Explanation    Comprehension Verbalized understanding            PT Short Term Goals - 08/02/19 1548      PT SHORT TERM GOAL #1   Title Patient will be independent in self management strategies to improve quality of life and functional outcomes.    Time 4    Period Weeks    Status New    Target Date 08/30/19      PT SHORT TERM GOAL #2   Title Patient will report at least 50% improvement in overall symptoms and/or functional ability.    Time 4    Period Weeks    Status New    Target Date  08/30/19             PT Long Term Goals - 08/02/19 1548      PT LONG TERM GOAL #1   Title Patient will report at least 75% improvement in overall symptoms and/or functional ability.    Time 8    Period Weeks    Status New    Target Date 09/27/19      PT LONG TERM GOAL #2   Title Patient will be able to demonstrate painfree lumbar mobility to improve gross mobility    Time 8    Period Weeks    Status New    Target Date 09/27/19                 Plan - 09/01/19 1518    Clinical Impression Statement Focused on educating patient on posture, sitting with anterior pelvic tilt and focusing on this between now and next session. Progress strengthening exercises and fatigue noted in low back, hips and hamstrings. Decreased pain noted end of session after strengthening exercises. Will continue to focus on lumbar mobility and strength.    Personal Factors and Comorbidities Age    Examination-Activity Limitations Sit;Transfers    Examination-Participation Restrictions Driving    Stability/Clinical Decision Making Stable/Uncomplicated    Rehab Potential Good    PT Frequency --   1-2x/week for total  of 8 visits over 8 week certification   PT Duration 8 weeks    PT Treatment/Interventions ADLs/Self Care Home Management;Aquatic Therapy;Cryotherapy;Electrical Stimulation;Moist Heat;Traction;Iontophoresis 4mg /ml Dexamethasone;Therapeutic exercise;Therapeutic activities;Functional mobility training;Neuromuscular re-education;Patient/family education;Manual techniques;Passive range of motion;Dry needling    PT Next Visit Plan f/u with hip hinge movement and anterior pelvic tilts, lumbar mobility.  Continue to improve core strength as well, extension focus.    PT Home Exercise Plan 5/11 self mobilization with tennis ball, book stretch (lumbar rotation), lumbar extension over towel roll; 08/12/19: bridge, sciatic nerve glide, prone press up 08/18/19 hip extension; 6/7 hip hinge in setaed and standing; 6/10 bridge walkouts, anterior tilts, supermans    Consulted and Agree with Plan of Care Patient           Patient will benefit from skilled therapeutic intervention in order to improve the following deficits and impairments:  Pain, Postural dysfunction, Decreased mobility  Visit Diagnosis: Lumbar pain  Pain in left hip     Problem List There are no problems to display for this patient.  3:40 PM, 09/01/19 Jerene Pitch, DPT Physical Therapy with [facility]  [phone] office  [place] [address] [place], [place], [address] Phone: [phone]   Fax:  [phone]  Name: GUILLERMO NEHRING MRN: [id number] Date of Birth: 1985/05/17

## 2019-09-05 ENCOUNTER — Ambulatory Visit (HOSPITAL_COMMUNITY): Payer: No Typology Code available for payment source | Admitting: Physical Therapy

## 2019-09-05 ENCOUNTER — Other Ambulatory Visit: Payer: Self-pay

## 2019-09-05 ENCOUNTER — Encounter (HOSPITAL_COMMUNITY): Payer: Self-pay | Admitting: Physical Therapy

## 2019-09-05 DIAGNOSIS — M545 Low back pain, unspecified: Secondary | ICD-10-CM

## 2019-09-05 DIAGNOSIS — M25552 Pain in left hip: Secondary | ICD-10-CM

## 2019-09-05 NOTE — Therapy (Signed)
[place], [place], [address] Phone: [phone]   Fax:  [phone]  Physical Therapy Treatment  Patient Details  Name: MALONE VANBLARCOM MRN: [id number] Date of Birth: [birth year] Referring Provider (PT): Malena Lovena Le   Encounter Date: 09/05/2019   PT End of Session - 09/05/19 1531    Visit Number 7    Number of Visits 8    Date for PT Re-Evaluation 09/27/19   eval 08/02/19   Authorization Type [facility] [employer], no ded, no auth    Progress Note Due on Visit 10    PT Start Time 1530    PT Stop Time 1610    PT Time Calculation (min) 40 min    Activity Tolerance Patient tolerated treatment well    Behavior During Therapy [facility] for tasks assessed/performed           History reviewed. No pertinent past medical history.  History reviewed. No pertinent surgical history.  There were no vitals filed for this visit.   Subjective Assessment - 09/05/19 1558    Subjective Patient reports he thinks he slept wrong as he woke up in pain, did exercises and pain went away. Still kind of feeling it right now in left lower hip. 4/10   with sitting but states he wasn't sitting properly. Felt good over the weekend.    Currently in Pain? Yes    Pain Score 4     Pain Location Back    Pain Orientation Posterior;Left    Pain Descriptors / Indicators Aching              [facility] PT Assessment - 09/05/19 0001      Assessment   Medical Diagnosis left piriformis syndrome    Referring Provider (PT) Malena Franco Nones Adult PT Treatment/Exercise - 09/05/19 0001      Lumbar Exercises: Standing   Other Standing Lumbar Exercises lumbar extension x15       Lumbar Exercises: Seated   Other Seated Lumbar Exercises posture holds on blue ball 3x45 " holds     Other Seated Lumbar Exercises hip hinges seated - with dowel 3x15; pallof press GTB B on ball 3x12       Knee/Hip Exercises: Seated     Knee/Hip Flexion seated on exercise ball anterior/posterior tilts inisolation and TRA activation - 6 minutes - prior demo    Other Seated Knee/Hip Exercises shoulder extension GTB x25 ,5" holds while seated on blue ball ; pelvic tilts seated wtih TRA activation - pain at mid spot where pain is - 6 minutes                    PT Short Term Goals - 08/02/19 1548      PT SHORT TERM GOAL #1   Title Patient will be independent in self management strategies to improve quality of life and functional outcomes.    Time 4    Period Weeks    Status New    Target Date 08/30/19      PT SHORT TERM GOAL #2   Title Patient will report at least 50% improvement in overall symptoms and/or functional ability.    Time 4    Period Weeks    Status New    Target Date 08/30/19  PT Long Term Goals - 08/02/19 1548      PT LONG TERM GOAL #1   Title Patient will report at least 75% improvement in overall symptoms and/or functional ability.    Time 8    Period Weeks    Status New    Target Date 09/27/19      PT LONG TERM GOAL #2   Title Patient will be able to demonstrate painfree lumbar mobility to improve gross mobility    Time 8    Period Weeks    Status New    Target Date 09/27/19                 Plan - 09/05/19 1644    Clinical Impression Statement Patient continues to have reoccurrence of pain in left low back with poor posture (posterior pelvic tilt), this improved with TRA activation with active pelvic tilts but pain persisted in passive positioning. Instructed patient to add active TRA pelvic tilts to HEP to determine if that helps with symptoms. Otherwise pain completely abolished with proper posture. Will reassess and f/u with Aps of lumbar spine next session.    Personal Factors and Comorbidities Age    Examination-Activity Limitations Sit;Transfers    Examination-Participation Restrictions Driving    Stability/Clinical Decision Making Stable/Uncomplicated     Rehab Potential Good    PT Frequency --   1-2x/week for total of 8 visits over 8 week certification   PT Duration 8 weeks    PT Treatment/Interventions ADLs/Self Care Home Management;Aquatic Therapy;Cryotherapy;Electrical Stimulation;Moist Heat;Traction;Iontophoresis 4mg /ml Dexamethasone;Therapeutic exercise;Therapeutic activities;Functional mobility training;Neuromuscular re-education;Patient/family education;Manual techniques;Passive range of motion;Dry needling    PT Next Visit Plan f/u pelvic tilts, APs of lumbar spine - PN  with hip hinge movement and anterior pelvic tilts, lumbar mobility.  Continue to improve core strength as well, extension focus.    PT Home Exercise Plan 5/11 self mobilization with tennis ball, book stretch (lumbar rotation), lumbar extension over towel roll; 08/12/19: bridge, sciatic nerve glide, prone press up 08/18/19 hip extension; 6/7 hip hinge in setaed and standing; 6/10 bridge walkouts, anterior tilts, supermans    Consulted and Agree with Plan of Care Patient           Patient will benefit from skilled therapeutic intervention in order to improve the following deficits and impairments:  Pain, Postural dysfunction, Decreased mobility  Visit Diagnosis: Lumbar pain  Pain in left hip     Problem List There are no problems to display for this patient.   5:00 PM, 09/05/19 09/07/19, DPT Physical Therapy with [facility]  [phone] office  [facility] [address] [place], [place], [place] Phone: [phone]   Fax:  [phone]  Name: JUSTICE MILLIRON MRN: Germaine Pomfret Date of Birth: Jul 28, 1985

## 2019-09-08 ENCOUNTER — Encounter (HOSPITAL_COMMUNITY): Payer: No Typology Code available for payment source | Admitting: Physical Therapy

## 2019-09-08 ENCOUNTER — Telehealth (HOSPITAL_COMMUNITY): Payer: Self-pay | Admitting: Physical Therapy

## 2019-09-08 NOTE — Telephone Encounter (Signed)
pt cancelled appt today because his kids have a fever

## 2019-09-13 ENCOUNTER — Ambulatory Visit (HOSPITAL_COMMUNITY): Payer: No Typology Code available for payment source | Admitting: Physical Therapy

## 2019-09-13 ENCOUNTER — Other Ambulatory Visit: Payer: Self-pay

## 2019-09-13 ENCOUNTER — Encounter (HOSPITAL_COMMUNITY): Payer: Self-pay | Admitting: Physical Therapy

## 2019-09-13 DIAGNOSIS — M545 Low back pain, unspecified: Secondary | ICD-10-CM

## 2019-09-13 DIAGNOSIS — M25552 Pain in left hip: Secondary | ICD-10-CM

## 2019-09-13 NOTE — Therapy (Signed)
[facility] [address] [place], [place], [address] Phone: [phone]   Fax:  [phone]  Physical Therapy Treatment/Discharge Summary  Patient Details  Name: Timothy Moody MRN: [id number] Date of Birth: 1986-02-10 Referring Provider (PT): Malena Lovena Le   Encounter Date: 09/13/2019  PHYSICAL THERAPY DISCHARGE SUMMARY  Visits from Start of Care: 8  Current functional level related to goals / functional outcomes: See below   Remaining deficits: See below   Education / Equipment: See below  Plan: Patient agrees to discharge.  Patient goals were partially met. Patient is being discharged due to being pleased with the current functional level.  ?????        PT End of Session - 09/13/19 0952    Visit Number 8    Number of Visits 8    Date for PT Re-Evaluation 09/27/19   eval 08/02/19   Authorization Type [employer], no ded, no auth    Progress Note Due on Visit 10    PT Start Time [phone]    PT Stop Time 1008    PT Time Calculation (min) 19 min    Activity Tolerance Patient tolerated treatment well    Behavior During Therapy [facility] for tasks assessed/performed           History reviewed. No pertinent past medical history.  History reviewed. No pertinent surgical history.  There were no vitals filed for this visit.   Subjective Assessment - 09/13/19 0948    Subjective Patient states 50% improvement with physical therapy intervention. He continues to be limited with pain but is able to complete ADL. The exercises are definitely helping and decrease symptoms for a while. He has no issues with his exercises. He continues to have pain in one spot of Anterior/posterior pelvic tilts.    Currently in Pain? Yes    Pain Score 2     Pain Location Back              [facility] PT Assessment - 09/13/19 0001      Assessment   Medical Diagnosis left piriformis syndrome    Referring Provider (PT) Malena Taylor       Precautions   Precautions None      Restrictions   Weight Bearing Restrictions No      Balance Screen   Has the patient fallen in the past 6 months No    Has the patient had a decrease in activity level because of a fear of falling?  No    Is the patient reluctant to leave their home because of a fear of falling?  No      Home Ecologist residence      Prior Function   Level of Independence Independent    Vocation Full time employment    Vocation Requirements cuts down trees      Cognition   Overall Cognitive Status Within Functional Limits for tasks assessed      Observation/Other Assessments   Observations sitting with improved posture    Focus on Therapeutic Outcomes (FOTO)  16% limited      AROM   Lumbar Flexion 0% limited    Lumbar Extension 0% limited    Lumbar - Right Side Bend 0% limited pain    Lumbar - Left Side Bend 0% limited    Lumbar - Right Rotation 0% limited    Lumbar - Left Rotation 0% limited      Strength  Right Hip Extension 5/5    Right Hip ABduction 5/5    Left Hip Extension 5/5    Left Hip ABduction 5/5                                 PT Education - 09/13/19 1010    Education Details Patient educated on progress made, reassessment findings, posture, hip hinge, movement patterns, core and muscle weakness contributing to symptoms, continuing HEP, returning to physical therapy if needed.    Person(s) Educated Patient    Methods Explanation;Demonstration    Comprehension Verbalized understanding;Returned demonstration            PT Short Term Goals - 09/13/19 0954      PT SHORT TERM GOAL #1   Title Patient will be independent in self management strategies to improve quality of life and functional outcomes.    Time 4    Period Weeks    Status Achieved    Target Date 08/30/19      PT SHORT TERM GOAL #2   Title Patient will report at least 50% improvement in overall symptoms and/or  functional ability.    Time 4    Period Weeks    Status Achieved    Target Date 08/30/19             PT Long Term Goals - 09/13/19 0954      PT LONG TERM GOAL #1   Title Patient will report at least 75% improvement in overall symptoms and/or functional ability.    Time 8    Period Weeks    Status Not Met      PT LONG TERM GOAL #2   Title Patient will be able to demonstrate painfree lumbar mobility to improve gross mobility    Time 8    Period Weeks    Status Not Met                 Plan - 09/13/19 0953    Clinical Impression Statement Patient has met 2/2 short term goals with self management strategies, and symptom improvement. He has met 0/2 long term goals with continued intermittent symptoms and some lumbar/hip discomfort with AROM. Patient has improved LE strength, lumbar ROM, improved movement patterns and improved functional mobility. Patient educated extensively on his condition, reassessment findings, progress made, hip hinging, continuing HEP, and returning to PT if needed. Patient continues to have intermittent symptoms in low back/hip. Patient discharged from therapy at this time.    Personal Factors and Comorbidities Age    Examination-Activity Limitations Sit;Transfers    Examination-Participation Restrictions Driving    Stability/Clinical Decision Making Stable/Uncomplicated    Rehab Potential Good    PT Frequency --   1-2x/week for total of 8 visits over 8 week certification   PT Duration 8 weeks    PT Treatment/Interventions ADLs/Self Care Home Management;Aquatic Therapy;Cryotherapy;Electrical Stimulation;Moist Heat;Traction;Iontophoresis 59m/ml Dexamethasone;Therapeutic exercise;Therapeutic activities;Functional mobility training;Neuromuscular re-education;Patient/family education;Manual techniques;Passive range of motion;Dry needling    PT Next Visit Plan n/a    PT Home Exercise Plan 5/11 self mobilization with tennis ball, book stretch (lumbar  rotation), lumbar extension over towel roll; 08/12/19: bridge, sciatic nerve glide, prone press up 08/18/19 hip extension; 6/7 hip hinge in setaed and standing; 6/10 bridge walkouts, anterior tilts, supermans    Consulted and Agree with Plan of Care Patient           Patient will benefit from  skilled therapeutic intervention in order to improve the following deficits and impairments:  Pain, Postural dysfunction, Decreased mobility  Visit Diagnosis: Lumbar pain  Pain in left hip     Problem List There are no problems to display for this patient.   10:16 AM, 09/13/19 Mearl Latin PT, DPT Physical Therapist at [facility] [place], [place], [address] Phone: [phone]   Fax:  [phone]  Name: Timothy Moody MRN: [id number] Date of Birth: 23-Mar-1986

## 2021-09-06 ENCOUNTER — Telehealth: Payer: No Typology Code available for payment source | Admitting: Emergency Medicine

## 2021-09-06 DIAGNOSIS — J020 Streptococcal pharyngitis: Secondary | ICD-10-CM | POA: Diagnosis not present

## 2021-09-06 MED ORDER — AMOXICILLIN 500 MG PO CAPS: 500.0000 mg | ORAL_CAPSULE | Freq: Two times a day (BID) | ORAL | 0 refills | Status: AC

## 2021-09-06 NOTE — Progress Notes (Signed)
Virtual Visit Consent   Timothy Moody, you are scheduled for a virtual visit with a [facility] provider today. Just as with appointments in the office, your consent must be obtained to participate. Your consent will be active for this visit and any virtual visit you may have with one of our providers in the next 365 days. If you have a MyChart account, a copy of this consent can be sent to you electronically.  As this is a virtual visit, video technology does not allow for your provider to perform a traditional examination. This may limit your provider's ability to fully assess your condition. If your provider identifies any concerns that need to be evaluated in person or the need to arrange testing (such as labs, EKG, etc.), we will make arrangements to do so. Although advances in technology are sophisticated, we cannot ensure that it will always work on either your end or our end. If the connection with a video visit is poor, the visit may have to be switched to a telephone visit. With either a video or telephone visit, we are not always able to ensure that we have a secure connection.  By engaging in this virtual visit, you consent to the provision of healthcare and authorize for your insurance to be billed (if applicable) for the services provided during this visit. Depending on your insurance coverage, you may receive a charge related to this service.  I need to obtain your verbal consent now. Are you willing to proceed with your visit today? [name] has provided verbal consent on 09/06/2021 for a virtual visit (video or telephone). Cathlyn Parsons, NP  Date: 09/06/2021 10:17 AM  Virtual Visit via Video Note   I, Cathlyn Parsons, connected with  Timothy Moody  ([id number], [birth year]) on 09/06/21 at 10:15 AM EDT by a video-enabled telemedicine application and verified that I am speaking with the correct person using two identifiers.  Location: Patient: Virtual Visit Location  Patient: Other: car in [place] Provider: Virtual Visit Location Provider: Home Office   I discussed the limitations of evaluation and management by telemedicine and the availability of in person appointments. The patient expressed understanding and agreed to proceed.    History of Present Illness: Timothy Moody is a 36 y.o. who identifies as a male who was assigned male at birth, and is being seen today for sore throat and fever.  He began feeling ill with chills the evening of 09/04/2021.  He complains of a sore throat that when he looks at it it is red and there are white spots on his throat.  He had a fever of 102 degrees yesterday.  He is also had headaches and felt achy.  He does not feel like he has a fever today but he has been taking Tylenol to manage his symptoms.  Last dose of Tylenol was this morning.  He has twin children and 1 child was positive last week for strep.  He denies nasal congestion, cough.  HPI: HPI  Problems: There are no problems to display for this patient.   Allergies: No Known Allergies Medications:  Current Outpatient Medications:    amoxicillin (AMOXIL) 500 MG capsule, Take 1 capsule (500 mg total) by mouth 2 (two) times daily for 10 days., Disp: 20 capsule, Rfl: 0   diclofenac (VOLTAREN) 75 MG EC tablet, Take 1 tablet (75 mg total) by mouth 2 (two) times daily., Disp: 60 tablet, Rfl: 0  Observations/Objective: Patient is well-developed, well-nourished  in no acute distress.  Resting comfortably  at home.  Head is normocephalic, atraumatic.  No labored breathing.  Speech is clear and coherent with logical content.  Patient is alert and oriented at baseline.    Assessment and Plan: 1. Strep pharyngitis  Rx amoxicillin. Reviewed supportive care  Follow Up Instructions: I discussed the assessment and treatment plan with the patient. The patient was provided an opportunity to ask questions and all were answered. The patient agreed with the plan and  demonstrated an understanding of the instructions.  A copy of instructions were sent to the patient via MyChart unless otherwise noted below.   The patient was advised to call back or seek an in-person evaluation if the symptoms worsen or if the condition fails to improve as anticipated.  Time:  I spent 6 minutes with the patient via telehealth technology discussing the above problems/concerns.    Cathlyn Parsons, NP

## 2021-09-06 NOTE — Patient Instructions (Signed)
  Timothy Moody, thank you for joining Cathlyn Parsons, NP for today's virtual visit.  While this provider is not your primary care provider (PCP), if your PCP is located in our provider database this encounter information will be shared with them immediately following your visit.  Consent: (Patient) Timothy Moody provided verbal consent for this virtual visit at the beginning of the encounter.  Current Medications:  Current Outpatient Medications:    amoxicillin (AMOXIL) 500 MG capsule, Take 1 capsule (500 mg total) by mouth 2 (two) times daily for 10 days., Disp: 20 capsule, Rfl: 0   diclofenac (VOLTAREN) 75 MG EC tablet, Take 1 tablet (75 mg total) by mouth 2 (two) times daily., Disp: 60 tablet, Rfl: 0   Medications ordered in this encounter:  Meds ordered this encounter  Medications   amoxicillin (AMOXIL) 500 MG capsule    Sig: Take 1 capsule (500 mg total) by mouth 2 (two) times daily for 10 days.    Dispense:  20 capsule    Refill:  0     *If you need refills on other medications prior to your next appointment, please contact your pharmacy*  Follow-Up: Call back or seek an in-person evaluation if the symptoms worsen or if the condition fails to improve as anticipated.  Other Instructions Continue using tylenol if needed for fever or pain per package instructions. Get a new toothbrush in 2 days.    If you have been instructed to have an in-person evaluation today at a local Urgent Care facility, please use the link below. It will take you to a list of all of our available [facility], including address, phone number and hours of operation. Please do not delay care.  [facility]  If you or a family member do not have a primary care provider, use the link below to schedule a visit and establish care. When you choose a [facility] primary care physician or advanced practice provider, you gain a long-term partner in health. Find a Primary Care  Provider  Learn more about [facility] in-office and virtual care options:  - Get Care Now

## 2022-08-22 ENCOUNTER — Inpatient Hospital Stay (HOSPITAL_COMMUNITY)
Admission: EM | Admit: 2022-08-22 | Discharge: 2022-08-28 | DRG: 957 | Disposition: A | Discharge status: Home / Self Care | Payer: 59 | Attending: General Surgery | Admitting: General Surgery

## 2022-08-22 ENCOUNTER — Emergency Department (HOSPITAL_COMMUNITY): Payer: 59

## 2022-08-22 ENCOUNTER — Encounter (HOSPITAL_COMMUNITY): Payer: Self-pay

## 2022-08-22 ENCOUNTER — Other Ambulatory Visit: Payer: Self-pay

## 2022-08-22 DIAGNOSIS — S270XXA Traumatic pneumothorax, initial encounter: Secondary | ICD-10-CM

## 2022-08-22 DIAGNOSIS — M79602 Pain in left arm: Secondary | ICD-10-CM | POA: Diagnosis not present

## 2022-08-22 DIAGNOSIS — Y905 Blood alcohol level of 100-119 mg/100 ml: Secondary | ICD-10-CM | POA: Diagnosis present

## 2022-08-22 DIAGNOSIS — J90 Pleural effusion, not elsewhere classified: Secondary | ICD-10-CM | POA: Diagnosis not present

## 2022-08-22 DIAGNOSIS — Z041 Encounter for examination and observation following transport accident: Secondary | ICD-10-CM | POA: Diagnosis not present

## 2022-08-22 DIAGNOSIS — Z23 Encounter for immunization: Secondary | ICD-10-CM | POA: Diagnosis not present

## 2022-08-22 DIAGNOSIS — Z1152 Encounter for screening for COVID-19: Secondary | ICD-10-CM

## 2022-08-22 DIAGNOSIS — F1090 Alcohol use, unspecified, uncomplicated: Secondary | ICD-10-CM | POA: Diagnosis present

## 2022-08-22 DIAGNOSIS — S2242XA Multiple fractures of ribs, left side, initial encounter for closed fracture: Secondary | ICD-10-CM | POA: Diagnosis not present

## 2022-08-22 DIAGNOSIS — S272XXA Traumatic hemopneumothorax, initial encounter: Secondary | ICD-10-CM | POA: Diagnosis present

## 2022-08-22 DIAGNOSIS — Z720 Tobacco use: Secondary | ICD-10-CM | POA: Diagnosis not present

## 2022-08-22 DIAGNOSIS — K661 Hemoperitoneum: Secondary | ICD-10-CM | POA: Diagnosis not present

## 2022-08-22 DIAGNOSIS — E876 Hypokalemia: Secondary | ICD-10-CM | POA: Diagnosis not present

## 2022-08-22 DIAGNOSIS — M4807 Spinal stenosis, lumbosacral region: Secondary | ICD-10-CM | POA: Diagnosis not present

## 2022-08-22 DIAGNOSIS — J9811 Atelectasis: Secondary | ICD-10-CM | POA: Diagnosis not present

## 2022-08-22 DIAGNOSIS — S36031A Moderate laceration of spleen, initial encounter: Secondary | ICD-10-CM | POA: Diagnosis not present

## 2022-08-22 DIAGNOSIS — J939 Pneumothorax, unspecified: Secondary | ICD-10-CM | POA: Diagnosis not present

## 2022-08-22 DIAGNOSIS — S52572A Other intraarticular fracture of lower end of left radius, initial encounter for closed fracture: Secondary | ICD-10-CM | POA: Diagnosis present

## 2022-08-22 DIAGNOSIS — S36039A Unspecified laceration of spleen, initial encounter: Secondary | ICD-10-CM

## 2022-08-22 DIAGNOSIS — S36032A Major laceration of spleen, initial encounter: Secondary | ICD-10-CM | POA: Diagnosis not present

## 2022-08-22 DIAGNOSIS — S2249XA Multiple fractures of ribs, unspecified side, initial encounter for closed fracture: Secondary | ICD-10-CM | POA: Diagnosis not present

## 2022-08-22 DIAGNOSIS — J942 Hemothorax: Secondary | ICD-10-CM | POA: Diagnosis present

## 2022-08-22 DIAGNOSIS — Z4682 Encounter for fitting and adjustment of non-vascular catheter: Secondary | ICD-10-CM | POA: Diagnosis not present

## 2022-08-22 DIAGNOSIS — S60512A Abrasion of left hand, initial encounter: Secondary | ICD-10-CM | POA: Diagnosis present

## 2022-08-22 DIAGNOSIS — S52352A Displaced comminuted fracture of shaft of radius, left arm, initial encounter for closed fracture: Secondary | ICD-10-CM | POA: Diagnosis not present

## 2022-08-22 DIAGNOSIS — S27321A Contusion of lung, unilateral, initial encounter: Secondary | ICD-10-CM | POA: Diagnosis not present

## 2022-08-22 DIAGNOSIS — F1010 Alcohol abuse, uncomplicated: Secondary | ICD-10-CM | POA: Diagnosis not present

## 2022-08-22 LAB — COMPREHENSIVE METABOLIC PANEL
ALT: 27 U/L (ref 0–44)
AST: 38 U/L (ref 15–41)
Albumin: 4.2 g/dL (ref 3.5–5.0)
Alkaline Phosphatase: 74 U/L (ref 38–126)
Anion gap: 12 (ref 5–15)
BUN: 9 mg/dL (ref 6–20)
CO2: 24 mmol/L (ref 22–32)
Calcium: 8.9 mg/dL (ref 8.9–10.3)
Chloride: 104 mmol/L (ref 98–111)
Creatinine, Ser: 0.99 mg/dL (ref 0.61–1.24)
GFR, Estimated: >60 mL/min (ref >60)
Glucose, Bld: 140 mg/dL — ABNORMAL HIGH (ref 70–99)
Potassium: 3.2 mmol/L — ABNORMAL LOW (ref 3.5–5.1)
Sodium: 140 mmol/L (ref 135–145)
Total Bilirubin: 0.6 mg/dL (ref 0.3–1.2)
Total Protein: 7.4 g/dL (ref 6.5–8.1)

## 2022-08-22 LAB — CBC
HCT: 38.9 % — ABNORMAL LOW (ref 39.0–52.0)
HCT: 35.5 % — ABNORMAL LOW (ref 39.0–52.0)
Hemoglobin: 13.2 g/dL (ref 13.0–17.0)
Hemoglobin: 12 g/dL — ABNORMAL LOW (ref 13.0–17.0)
MCH: 31.4 pg (ref 26.0–34.0)
MCH: 31.2 pg (ref 26.0–34.0)
MCHC: 33.9 g/dL (ref 30.0–36.0)
MCHC: 33.8 g/dL (ref 30.0–36.0)
MCV: 92.4 fL (ref 80.0–100.0)
MCV: 92.2 fL (ref 80.0–100.0)
Platelets: 282 10*3/uL (ref 150–400)
Platelets: 228 10*3/uL (ref 150–400)
RBC: 4.21 MIL/uL — ABNORMAL LOW (ref 4.22–5.81)
RBC: 3.85 MIL/uL — ABNORMAL LOW (ref 4.22–5.81)
RDW: 12.3 % (ref 11.5–15.5)
RDW: 12.5 % (ref 11.5–15.5)
WBC: 17.4 10*3/uL — ABNORMAL HIGH (ref 4.0–10.5)
WBC: 14.2 10*3/uL — ABNORMAL HIGH (ref 4.0–10.5)
nRBC: 0 % (ref 0.0–0.2)
nRBC: 0 % (ref 0.0–0.2)

## 2022-08-22 LAB — I-STAT CHEM 8, ED
BUN: 11 mg/dL (ref 6–20)
Calcium, Ion: 1.16 mmol/L (ref 1.15–1.40)
Chloride: 105 mmol/L (ref 98–111)
Creatinine, Ser: 1.1 mg/dL (ref 0.61–1.24)
Glucose, Bld: 132 mg/dL — ABNORMAL HIGH (ref 70–99)
HCT: 44 % (ref 39.0–52.0)
Hemoglobin: 15 g/dL (ref 13.0–17.0)
Potassium: 3.3 mmol/L — ABNORMAL LOW (ref 3.5–5.1)
Sodium: 144 mmol/L (ref 135–145)
TCO2: 28 mmol/L (ref 22–32)

## 2022-08-22 LAB — ETHANOL: Alcohol, Ethyl (B): 113 mg/dL — ABNORMAL HIGH (ref <10)

## 2022-08-22 LAB — LACTIC ACID, PLASMA: Lactic Acid, Venous: 3.1 mmol/L (ref 0.5–1.9)

## 2022-08-22 LAB — GLUCOSE, CAPILLARY: Glucose-Capillary: 136 mg/dL — ABNORMAL HIGH (ref 70–99)

## 2022-08-22 LAB — MRSA NEXT GEN BY PCR, NASAL: MRSA by PCR Next Gen: NOT DETECTED

## 2022-08-22 LAB — SAMPLE TO BLOOD BANK

## 2022-08-22 MED ORDER — ADULT MULTIVITAMIN W/MINERALS CH
1.0000 | ORAL_TABLET | Freq: Every day | ORAL | Status: DC
  Administered 2022-08-22 – 2022-08-28 (×7): 1 via ORAL
  Filled 2022-08-22 (×7): qty 1

## 2022-08-22 MED ORDER — FOLIC ACID 1 MG PO TABS
1.0000 mg | ORAL_TABLET | Freq: Every day | ORAL | Status: DC
  Administered 2022-08-22 – 2022-08-28 (×6): 1 mg via ORAL
  Filled 2022-08-22 (×6): qty 1

## 2022-08-22 MED ORDER — OXYCODONE HCL 5 MG PO TABS
10.0000 mg | ORAL_TABLET | ORAL | Status: DC | PRN
  Administered 2022-08-22 – 2022-08-25 (×11): 10 mg via ORAL
  Filled 2022-08-22 (×11): qty 2

## 2022-08-22 MED ORDER — TETANUS-DIPHTH-ACELL PERTUSSIS 5-2.5-18.5 LF-MCG/0.5 IM SUSY
0.5000 mL | PREFILLED_SYRINGE | Freq: Once | INTRAMUSCULAR | Status: AC
  Administered 2022-08-22: 0.5 mL via INTRAMUSCULAR

## 2022-08-22 MED ORDER — OXYCODONE HCL 5 MG PO TABS: 5.0000 mg | ORAL_TABLET | ORAL | Status: DC | PRN

## 2022-08-22 MED ORDER — THIAMINE MONONITRATE 100 MG PO TABS
100.0000 mg | ORAL_TABLET | Freq: Every day | ORAL | Status: DC
  Administered 2022-08-22 – 2022-08-28 (×6): 100 mg via ORAL
  Filled 2022-08-22 (×6): qty 1

## 2022-08-22 MED ORDER — DOCUSATE SODIUM 100 MG PO CAPS
100.0000 mg | ORAL_CAPSULE | Freq: Two times a day (BID) | ORAL | Status: DC
  Administered 2022-08-22 – 2022-08-28 (×8): 100 mg via ORAL
  Filled 2022-08-22 (×9): qty 1

## 2022-08-22 MED ORDER — THIAMINE HCL 100 MG/ML IJ SOLN
100.0000 mg | Freq: Every day | INTRAMUSCULAR | Status: DC
  Filled 2022-08-22: qty 2

## 2022-08-22 MED ORDER — POTASSIUM CHLORIDE IN NACL 20-0.9 MEQ/L-% IV SOLN
INTRAVENOUS | Status: DC
  Filled 2022-08-22 (×9): qty 1000

## 2022-08-22 MED ORDER — MORPHINE SULFATE (PF) 4 MG/ML IV SOLN
4.0000 mg | Freq: Once | INTRAVENOUS | Status: AC
  Administered 2022-08-22: 4 mg via INTRAVENOUS
  Filled 2022-08-22: qty 1

## 2022-08-22 MED ORDER — HYDROMORPHONE HCL 1 MG/ML IJ SOLN
1.0000 mg | INTRAMUSCULAR | Status: DC | PRN
  Administered 2022-08-22 – 2022-08-25 (×4): 1 mg via INTRAVENOUS
  Filled 2022-08-22 (×5): qty 1

## 2022-08-22 MED ORDER — METHOCARBAMOL 500 MG PO TABS
500.0000 mg | ORAL_TABLET | Freq: Three times a day (TID) | ORAL | Status: AC
  Administered 2022-08-22 – 2022-08-25 (×8): 500 mg via ORAL
  Filled 2022-08-22 (×8): qty 1

## 2022-08-22 MED ORDER — MORPHINE SULFATE (PF) 4 MG/ML IV SOLN
4.0000 mg | INTRAVENOUS | Status: DC | PRN
  Administered 2022-08-22: 4 mg via INTRAVENOUS
  Filled 2022-08-22: qty 1

## 2022-08-22 MED ORDER — IOHEXOL 350 MG/ML SOLN
75.0000 mL | Freq: Once | INTRAVENOUS | Status: AC | PRN
  Administered 2022-08-22: 75 mL via INTRAVENOUS

## 2022-08-22 MED ORDER — PROPOFOL 10 MG/ML IV BOLUS: 20.0000 mg | INTRAVENOUS | Status: DC | PRN

## 2022-08-22 MED ORDER — ONDANSETRON HCL 4 MG/2ML IJ SOLN
4.0000 mg | Freq: Once | INTRAMUSCULAR | Status: AC
  Administered 2022-08-22: 4 mg via INTRAVENOUS
  Filled 2022-08-22: qty 2

## 2022-08-22 MED ORDER — PROPOFOL 10 MG/ML IV BOLUS
INTRAVENOUS | Status: AC
  Filled 2022-08-22: qty 20

## 2022-08-22 MED ORDER — ACETAMINOPHEN 500 MG PO TABS
1000.0000 mg | ORAL_TABLET | Freq: Four times a day (QID) | ORAL | Status: DC
  Administered 2022-08-23 – 2022-08-28 (×21): 1000 mg via ORAL
  Filled 2022-08-22 (×23): qty 2

## 2022-08-22 MED ORDER — ONDANSETRON 4 MG PO TBDP: 4.0000 mg | ORAL_TABLET | Freq: Four times a day (QID) | ORAL | Status: DC | PRN

## 2022-08-22 MED ORDER — MORPHINE SULFATE (PF) 2 MG/ML IV SOLN
2.0000 mg | INTRAVENOUS | Status: DC | PRN
  Administered 2022-08-22 – 2022-08-23 (×3): 4 mg via INTRAVENOUS
  Filled 2022-08-22: qty 2
  Filled 2022-08-22: qty 1
  Filled 2022-08-22 (×2): qty 2

## 2022-08-22 MED ORDER — PROPOFOL 10 MG/ML IV BOLUS
INTRAVENOUS | Status: AC | PRN
  Administered 2022-08-22 (×8): 20 mg via INTRAVENOUS
  Administered 2022-08-22: 40 mg via INTRAVENOUS

## 2022-08-22 MED ORDER — ORAL CARE MOUTH RINSE: 15.0000 mL | OROMUCOSAL | Status: DC | PRN

## 2022-08-22 MED ORDER — LORAZEPAM 1 MG PO TABS
1.0000 mg | ORAL_TABLET | ORAL | Status: AC | PRN
  Administered 2022-08-22 – 2022-08-24 (×2): 1 mg via ORAL
  Filled 2022-08-22 (×2): qty 1

## 2022-08-22 MED ORDER — ONDANSETRON HCL 4 MG/2ML IJ SOLN
4.0000 mg | Freq: Four times a day (QID) | INTRAMUSCULAR | Status: DC | PRN
  Administered 2022-08-22: 4 mg via INTRAVENOUS
  Filled 2022-08-22: qty 2

## 2022-08-22 MED ORDER — KETAMINE HCL 50 MG/5ML IJ SOSY
PREFILLED_SYRINGE | INTRAMUSCULAR | Status: AC
  Filled 2022-08-22: qty 10

## 2022-08-22 MED ORDER — CEFAZOLIN SODIUM-DEXTROSE 2-4 GM/100ML-% IV SOLN
2.0000 g | Freq: Once | INTRAVENOUS | Status: AC
  Administered 2022-08-22: 2 g via INTRAVENOUS

## 2022-08-22 MED ORDER — HYDRALAZINE HCL 20 MG/ML IJ SOLN: 10.0000 mg | INTRAMUSCULAR | Status: DC | PRN

## 2022-08-22 MED ORDER — METOPROLOL TARTRATE 5 MG/5ML IV SOLN: 5.0000 mg | Freq: Four times a day (QID) | INTRAVENOUS | Status: DC | PRN

## 2022-08-22 MED ORDER — CHLORHEXIDINE GLUCONATE CLOTH 2 % EX PADS
6.0000 | MEDICATED_PAD | Freq: Every day | CUTANEOUS | Status: DC
  Administered 2022-08-22 – 2022-08-28 (×7): 6 via TOPICAL

## 2022-08-22 MED ORDER — METHOCARBAMOL 1000 MG/10ML IJ SOLN: 500.0000 mg | Freq: Three times a day (TID) | INTRAVENOUS | Status: AC | PRN

## 2022-08-22 MED ORDER — POLYETHYLENE GLYCOL 3350 17 G PO PACK: 17.0000 g | PACK | Freq: Every day | ORAL | Status: DC | PRN

## 2022-08-22 NOTE — Plan of Care (Signed)

## 2022-08-22 NOTE — Progress Notes (Signed)
   08/22/22 1720  Spiritual Encounters  Type of Visit Declined chaplain visit  Referral source Trauma page  Reason for visit Trauma  OnCall Visit Yes   Patient was appreciative of chaplain's presence but declined a visit.    Arlyce Dice, Chaplain Resident

## 2022-08-22 NOTE — Progress Notes (Signed)
Chest Tube Insertion Procedure Note  Indications:  Clinically significant Pneumothorax  Pre-operative Diagnosis: Pneumothorax - left  Post-operative Diagnosis: Pneumothorax - left  Procedure Details  Informed consent was obtained for the procedure, including sedation.  Risks of lung perforation, hemorrhage, arrhythmia, and adverse drug reaction were discussed. Moderate sedation with propofol  performed by Dr Particia Nearing  After sterile skin prep, using standard technique, a 14 French tube Cook/wayne chest tube was placed in the left lateral 4th rib space. Secured to skin with two interrupted silk sutures. No air leak; minimal blood  Findings: +air  Estimated Blood Loss:  Minimal         Specimens:  None              Complications:  None; patient tolerated the procedure well.         Disposition:  ED         Condition: stable  Attending Attestation: I performed the procedure.   Mary Sella. Andrey Campanile, MD, FACS General, Bariatric, & Minimally Invasive Surgery [facility],  [facility]

## 2022-08-22 NOTE — Progress Notes (Signed)
Orthopedic Tech Progress Note Patient Details:  Timothy Moody Mar 30, 1985 [id number]  Ortho Devices Type of Ortho Device: Sugartong splint Ortho Device/Splint Location: LUE Ortho Device/Splint Interventions: Ordered, Application, Adjustment   Post Interventions Patient Tolerated: Well Splint applied post reduction. [name] 08/22/2022, 7:10 PM
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
## 2022-08-23 ENCOUNTER — Inpatient Hospital Stay (HOSPITAL_COMMUNITY): Payer: 59

## 2022-08-23 DIAGNOSIS — S2242XA Multiple fractures of ribs, left side, initial encounter for closed fracture: Secondary | ICD-10-CM

## 2022-08-23 DIAGNOSIS — S270XXA Traumatic pneumothorax, initial encounter: Secondary | ICD-10-CM

## 2022-08-23 DIAGNOSIS — M79602 Pain in left arm: Secondary | ICD-10-CM

## 2022-08-23 LAB — BASIC METABOLIC PANEL
Anion gap: 9 (ref 5–15)
BUN: 9 mg/dL (ref 6–20)
CO2: 23 mmol/L (ref 22–32)
Calcium: 7.9 mg/dL — ABNORMAL LOW (ref 8.9–10.3)
Chloride: 105 mmol/L (ref 98–111)
Creatinine, Ser: 0.81 mg/dL (ref 0.61–1.24)
GFR, Estimated: >60 mL/min (ref >60)
Glucose, Bld: 135 mg/dL — ABNORMAL HIGH (ref 70–99)
Potassium: 3.7 mmol/L (ref 3.5–5.1)
Sodium: 137 mmol/L (ref 135–145)

## 2022-08-23 LAB — CBC
HCT: 33.5 % — ABNORMAL LOW (ref 39.0–52.0)
HCT: 33.2 % — ABNORMAL LOW (ref 39.0–52.0)
HCT: 32.4 % — ABNORMAL LOW (ref 39.0–52.0)
Hemoglobin: 11.4 g/dL — ABNORMAL LOW (ref 13.0–17.0)
Hemoglobin: 11.6 g/dL — ABNORMAL LOW (ref 13.0–17.0)
Hemoglobin: 11.1 g/dL — ABNORMAL LOW (ref 13.0–17.0)
MCH: 31.4 pg (ref 26.0–34.0)
MCH: 32 pg (ref 26.0–34.0)
MCH: 32.3 pg (ref 26.0–34.0)
MCHC: 34 g/dL (ref 30.0–36.0)
MCHC: 34.9 g/dL (ref 30.0–36.0)
MCHC: 34.3 g/dL (ref 30.0–36.0)
MCV: 92.3 fL (ref 80.0–100.0)
MCV: 91.7 fL (ref 80.0–100.0)
MCV: 94.2 fL (ref 80.0–100.0)
Platelets: 201 10*3/uL (ref 150–400)
Platelets: 188 10*3/uL (ref 150–400)
Platelets: 177 10*3/uL (ref 150–400)
RBC: 3.63 MIL/uL — ABNORMAL LOW (ref 4.22–5.81)
RBC: 3.62 MIL/uL — ABNORMAL LOW (ref 4.22–5.81)
RBC: 3.44 MIL/uL — ABNORMAL LOW (ref 4.22–5.81)
RDW: 12.3 % (ref 11.5–15.5)
RDW: 12.4 % (ref 11.5–15.5)
RDW: 12.3 % (ref 11.5–15.5)
WBC: 8 10*3/uL (ref 4.0–10.5)
WBC: 8.7 10*3/uL (ref 4.0–10.5)
WBC: 7.8 10*3/uL (ref 4.0–10.5)
nRBC: 0 % (ref 0.0–0.2)
nRBC: 0 % (ref 0.0–0.2)
nRBC: 0 % (ref 0.0–0.2)

## 2022-08-23 LAB — URINALYSIS, ROUTINE W REFLEX MICROSCOPIC
Bilirubin Urine: NEGATIVE
Glucose, UA: NEGATIVE mg/dL
Hgb urine dipstick: NEGATIVE
Ketones, ur: 5 mg/dL — AB
Leukocytes,Ua: NEGATIVE
Nitrite: NEGATIVE
Protein, ur: NEGATIVE mg/dL
Specific Gravity, Urine: 1.026 (ref 1.005–1.030)
pH: 5 (ref 5.0–8.0)

## 2022-08-23 LAB — PROTIME-INR
INR: 1.2 (ref 0.8–1.2)
Prothrombin Time: 14.9 seconds (ref 11.4–15.2)

## 2022-08-23 LAB — HIV ANTIBODY (ROUTINE TESTING W REFLEX): HIV Screen 4th Generation wRfx: NONREACTIVE

## 2022-08-23 MED ORDER — GABAPENTIN 300 MG PO CAPS
300.0000 mg | ORAL_CAPSULE | Freq: Three times a day (TID) | ORAL | Status: DC
  Administered 2022-08-23 – 2022-08-28 (×15): 300 mg via ORAL
  Filled 2022-08-23 (×16): qty 1
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
## 2022-08-23 NOTE — Progress Notes (Signed)
Pt has been HD stable with stable cbc Will transfer to progressive unit   Timothy Moody. Andrey Campanile, MD, FACS General, Bariatric, & Minimally Invasive Surgery [facility],  A [facility]
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
## 2022-08-23 NOTE — H&P (View-Only) (Signed)
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 ribs.  Chest drain placed for hemopneumothorax.  Likely also has a pulmonary contusion but no respiratory compromise.  He would benefit from rib plating to stabilize the fractures, both in terms of pain and reestablishing the left pleural space.  I discussed the option of rib plating vs conservative management with Mr. Haran and his family.  They understand the general nature of the procedure, including the need for general anesthesia, the incision to be used, the use of a drainage tube postoperatively, the expected hospital stay and the overall recovery period.  I informed them of the indications, risks, benefits and alternatives.  They understand the risks include, but are not limited to death, DVT, PE, bleeding, possible need for transfusion, infection, as well as the possibility of other unforeseeable complications.  He is at risk for chronic pain with or without plating, but more  likely to have long term issues if not corrected.  Salvatore Decent Dorris Fetch, MD [facility] [phone]   [name] 08/23/2022, 11:02 AM
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
## 2022-08-24 ENCOUNTER — Inpatient Hospital Stay (HOSPITAL_COMMUNITY): Payer: 59

## 2022-08-24 LAB — CBC
HCT: 30.2 % — ABNORMAL LOW (ref 39.0–52.0)
HCT: 31.1 % — ABNORMAL LOW (ref 39.0–52.0)
HCT: 31.7 % — ABNORMAL LOW (ref 39.0–52.0)
Hemoglobin: 10.3 g/dL — ABNORMAL LOW (ref 13.0–17.0)
Hemoglobin: 10.5 g/dL — ABNORMAL LOW (ref 13.0–17.0)
Hemoglobin: 11.1 g/dL — ABNORMAL LOW (ref 13.0–17.0)
MCH: 31.2 pg (ref 26.0–34.0)
MCH: 31 pg (ref 26.0–34.0)
MCH: 32.1 pg (ref 26.0–34.0)
MCHC: 34.1 g/dL (ref 30.0–36.0)
MCHC: 33.8 g/dL (ref 30.0–36.0)
MCHC: 35 g/dL (ref 30.0–36.0)
MCV: 91.5 fL (ref 80.0–100.0)
MCV: 91.7 fL (ref 80.0–100.0)
MCV: 91.6 fL (ref 80.0–100.0)
Platelets: 164 10*3/uL (ref 150–400)
Platelets: 158 10*3/uL (ref 150–400)
Platelets: 165 10*3/uL (ref 150–400)
RBC: 3.3 MIL/uL — ABNORMAL LOW (ref 4.22–5.81)
RBC: 3.39 MIL/uL — ABNORMAL LOW (ref 4.22–5.81)
RBC: 3.46 MIL/uL — ABNORMAL LOW (ref 4.22–5.81)
RDW: 12.1 % (ref 11.5–15.5)
RDW: 12.1 % (ref 11.5–15.5)
RDW: 12.1 % (ref 11.5–15.5)
WBC: 8.1 10*3/uL (ref 4.0–10.5)
WBC: 7.1 10*3/uL (ref 4.0–10.5)
WBC: 7.3 10*3/uL (ref 4.0–10.5)
nRBC: 0 % (ref 0.0–0.2)
nRBC: 0 % (ref 0.0–0.2)
nRBC: 0 % (ref 0.0–0.2)

## 2022-08-24 LAB — ABO/RH: ABO/RH(D): O NEG

## 2022-08-24 LAB — TYPE AND SCREEN
ABO/RH(D): O NEG
Antibody Screen: NEGATIVE

## 2022-08-24 LAB — BASIC METABOLIC PANEL
Anion gap: 8 (ref 5–15)
BUN: <5 mg/dL — ABNORMAL LOW (ref 6–20)
CO2: 24 mmol/L (ref 22–32)
Calcium: 7.9 mg/dL — ABNORMAL LOW (ref 8.9–10.3)
Chloride: 104 mmol/L (ref 98–111)
Creatinine, Ser: 0.8 mg/dL (ref 0.61–1.24)
GFR, Estimated: >60 mL/min (ref >60)
Glucose, Bld: 112 mg/dL — ABNORMAL HIGH (ref 70–99)
Potassium: 3.9 mmol/L (ref 3.5–5.1)
Sodium: 136 mmol/L (ref 135–145)

## 2022-08-24 MED ORDER — CEFAZOLIN SODIUM-DEXTROSE 2-4 GM/100ML-% IV SOLN
2.0000 g | INTRAVENOUS | Status: AC
  Administered 2022-08-25: 2 g via INTRAVENOUS
  Filled 2022-08-24: qty 100

## 2022-08-24 NOTE — Progress Notes (Signed)
Procedure(s) (LRB): THOROCOTOMY W/RIB PLATING (Left) RIB PLATING (Left) Subjective: Still has significant rib pain   Objective: Vital signs in last 24 hours: Temp:  [97.5 F (36.4 C)-98.4 F (36.9 C)] 97.5 F (36.4 C) (06/02 0845) Pulse Rate:  [76-94] 79 (06/02 0845) Cardiac Rhythm: Normal sinus rhythm (06/02 0729) Resp:  [13-28] 16 (06/02 0845) BP: (113-130)/(70-92) 128/85 (06/02 0845) SpO2:  [96 %-100 %] 100 % (06/02 0845)  Hemodynamic parameters for last 24 hours:    Intake/Output from previous day: 06/01 0701 - 06/02 0700 In: 1950 [P.O.:350; I.V.:1600] Out: 475 [Chest Tube:475] Intake/Output this shift: Total I/O In: -  Out: 20 [Chest Tube:20]  General appearance: alert, cooperative, and no distress Neurologic: intact Heart: regular rate and rhythm Lungs: clear to auscultation bilaterally  Lab Results: Recent Labs    08/23/22 2240 08/24/22 0614  WBC 7.8 8.1  HGB 11.1* 10.3*  HCT 32.4* 30.2*  PLT 177 164   BMET:  Recent Labs    08/23/22 0803 08/24/22 0614  NA 137 136  K 3.7 3.9  CL 105 104  CO2 23 24  GLUCOSE 135* 112*  BUN 9 <5*  CREATININE 0.81 0.80  CALCIUM 7.9* 7.9*    PT/INR:  Recent Labs    08/22/22 2256  LABPROT 14.9  INR 1.2   ABG    Component Value Date/Time   TCO2 28 08/22/2022 1713   CBG (last 3)  Recent Labs    08/22/22 2319  GLUCAP 136*    Assessment/Plan: S/P Procedure(s) (LRB): THOROCOTOMY W/RIB PLATING (Left) RIB PLATING (Left) Multiple displaced left sided rib fractures For rib plating tomorrow He is aware of the risks and benefits. All questions answered.   LOS: 2 days    [name] 08/24/2022

## 2022-08-24 NOTE — Anesthesia Preprocedure Evaluation (Signed)
Anesthesia Evaluation  Patient identified by MRN, date of birth, ID band Patient awake    Reviewed: Allergy & Precautions, NPO status , Patient's Chart, lab work & pertinent test results  Airway Mallampati: I  TM Distance: >3 FB Neck ROM: Full    Dental no notable dental hx. (+) Dental Advisory Given, Teeth Intact   Pulmonary neg pulmonary ROS   breath sounds clear to auscultation + decreased breath sounds      Cardiovascular negative cardio ROS Normal cardiovascular exam Rhythm:Regular Rate:Normal     Neuro/Psych negative neurological ROS     GI/Hepatic negative GI ROS, Neg liver ROS,,,  Endo/Other  negative endocrine ROS    Renal/GU negative Renal ROS     Musculoskeletal negative musculoskeletal ROS (+)    Abdominal   Peds  Hematology negative hematology ROS (+)   Anesthesia Other Findings   Reproductive/Obstetrics                             Anesthesia Physical Anesthesia Plan  ASA: 2  Anesthesia Plan: General   Post-op Pain Management: Tylenol PO (pre-op)* and Gabapentin PO (pre-op)*   Induction: Intravenous  PONV Risk Score and Plan: 2 and Ondansetron and Treatment may vary due to age or medical condition  Airway Management Planned: Double Lumen EBT and Oral ETT  Additional Equipment: Arterial line  Intra-op Plan:   Post-operative Plan: Possible Post-op intubation/ventilation  Informed Consent: I have reviewed the patients History and Physical, chart, labs and discussed the procedure including the risks, benefits and alternatives for the proposed anesthesia with the patient or authorized representative who has indicated his/her understanding and acceptance.     Dental advisory given  Plan Discussed with: CRNA  Anesthesia Plan Comments:         Anesthesia Quick Evaluation
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
## 2022-08-25 ENCOUNTER — Inpatient Hospital Stay (HOSPITAL_COMMUNITY): Payer: 59

## 2022-08-25 ENCOUNTER — Inpatient Hospital Stay (HOSPITAL_COMMUNITY): Payer: 59 | Admitting: Anesthesiology

## 2022-08-25 ENCOUNTER — Encounter (HOSPITAL_COMMUNITY): Payer: Self-pay

## 2022-08-25 ENCOUNTER — Encounter (HOSPITAL_COMMUNITY): Admission: EM | Disposition: A | Discharge status: Home / Self Care | Payer: Self-pay | Source: Home / Self Care

## 2022-08-25 DIAGNOSIS — S2242XA Multiple fractures of ribs, left side, initial encounter for closed fracture: Secondary | ICD-10-CM | POA: Diagnosis not present

## 2022-08-25 HISTORY — PX: THOROCOTOMY W/RIB PLATING: SHX6117

## 2022-08-25 HISTORY — PX: RIB PLATING: SHX5079

## 2022-08-25 LAB — CBC
HCT: 31.8 % — ABNORMAL LOW (ref 39.0–52.0)
Hemoglobin: 10.8 g/dL — ABNORMAL LOW (ref 13.0–17.0)
MCH: 31.2 pg (ref 26.0–34.0)
MCHC: 34 g/dL (ref 30.0–36.0)
MCV: 91.9 fL (ref 80.0–100.0)
Platelets: 174 10*3/uL (ref 150–400)
RBC: 3.46 MIL/uL — ABNORMAL LOW (ref 4.22–5.81)
RDW: 12.1 % (ref 11.5–15.5)
WBC: 9 10*3/uL (ref 4.0–10.5)
nRBC: 0 % (ref 0.0–0.2)

## 2022-08-25 LAB — SARS CORONAVIRUS 2 BY RT PCR: SARS Coronavirus 2 by RT PCR: NEGATIVE

## 2022-08-25 SURGERY — THORACOTOMY, WITH RIB FIXATION USING PLATE
Anesthesia: General | Site: Thoracic | Laterality: Left

## 2022-08-25 MED ORDER — PROPOFOL 10 MG/ML IV BOLUS
INTRAVENOUS | Status: DC | PRN
  Administered 2022-08-25: 160 mg via INTRAVENOUS
  Administered 2022-08-25: 40 mg via INTRAVENOUS

## 2022-08-25 MED ORDER — HYDROMORPHONE HCL 1 MG/ML IJ SOLN: 0.2500 mg | INTRAMUSCULAR | Status: DC | PRN

## 2022-08-25 MED ORDER — ESMOLOL HCL 100 MG/10ML IV SOLN
INTRAVENOUS | Status: DC | PRN
  Administered 2022-08-25: 20 mg via INTRAVENOUS

## 2022-08-25 MED ORDER — MEPERIDINE HCL 25 MG/ML IJ SOLN: 6.2500 mg | INTRAMUSCULAR | Status: DC | PRN

## 2022-08-25 MED ORDER — DEXAMETHASONE SODIUM PHOSPHATE 10 MG/ML IJ SOLN
INTRAMUSCULAR | Status: DC | PRN
  Administered 2022-08-25: 10 mg via INTRAVENOUS

## 2022-08-25 MED ORDER — ACETAMINOPHEN 500 MG PO TABS
ORAL_TABLET | ORAL | Status: AC
  Filled 2022-08-25: qty 2

## 2022-08-25 MED ORDER — GLYCOPYRROLATE PF 0.2 MG/ML IJ SOSY
PREFILLED_SYRINGE | INTRAMUSCULAR | Status: DC | PRN
  Administered 2022-08-25: .2 mg via INTRAVENOUS

## 2022-08-25 MED ORDER — FENTANYL CITRATE (PF) 250 MCG/5ML IJ SOLN
INTRAMUSCULAR | Status: AC
  Filled 2022-08-25: qty 5

## 2022-08-25 MED ORDER — HYDROMORPHONE HCL 1 MG/ML IJ SOLN
INTRAMUSCULAR | Status: DC | PRN
  Administered 2022-08-25 (×2): .25 mg via INTRAVENOUS

## 2022-08-25 MED ORDER — PROPOFOL 10 MG/ML IV BOLUS
INTRAVENOUS | Status: AC
  Filled 2022-08-25: qty 20

## 2022-08-25 MED ORDER — OXYCODONE HCL 5 MG PO TABS
5.0000 mg | ORAL_TABLET | ORAL | Status: DC | PRN
  Administered 2022-08-25: 10 mg via ORAL
  Administered 2022-08-25: 5 mg via ORAL
  Administered 2022-08-25 – 2022-08-26 (×3): 10 mg via ORAL
  Filled 2022-08-25: qty 1
  Filled 2022-08-25 (×4): qty 2

## 2022-08-25 MED ORDER — BUPIVACAINE-EPINEPHRINE 0.25% -1:200000 IJ SOLN: INTRAMUSCULAR | Status: DC | PRN

## 2022-08-25 MED ORDER — ROCURONIUM BROMIDE 10 MG/ML (PF) SYRINGE
PREFILLED_SYRINGE | INTRAVENOUS | Status: DC | PRN
  Administered 2022-08-25: 70 mg via INTRAVENOUS
  Administered 2022-08-25: 30 mg via INTRAVENOUS
  Administered 2022-08-25: 20 mg via INTRAVENOUS

## 2022-08-25 MED ORDER — LIDOCAINE 2% (20 MG/ML) 5 ML SYRINGE
INTRAMUSCULAR | Status: AC
  Filled 2022-08-25: qty 5

## 2022-08-25 MED ORDER — CHLORHEXIDINE GLUCONATE 0.12 % MT SOLN: 15.0000 mL | Freq: Once | OROMUCOSAL | Status: AC

## 2022-08-25 MED ORDER — SUGAMMADEX SODIUM 200 MG/2ML IV SOLN
INTRAVENOUS | Status: DC | PRN
  Administered 2022-08-25: 200 mg via INTRAVENOUS

## 2022-08-25 MED ORDER — DEXMEDETOMIDINE HCL IN NACL 80 MCG/20ML IV SOLN
INTRAVENOUS | Status: DC | PRN
  Administered 2022-08-25 (×5): 8 ug via INTRAVENOUS

## 2022-08-25 MED ORDER — 0.9 % SODIUM CHLORIDE (POUR BTL) OPTIME
TOPICAL | Status: DC | PRN
  Administered 2022-08-25: 2000 mL
  Administered 2022-08-25: 1000 mL

## 2022-08-25 MED ORDER — GLYCOPYRROLATE PF 0.2 MG/ML IJ SOSY
PREFILLED_SYRINGE | INTRAMUSCULAR | Status: AC
  Filled 2022-08-25: qty 1

## 2022-08-25 MED ORDER — ONDANSETRON HCL 4 MG/2ML IJ SOLN
INTRAMUSCULAR | Status: AC
  Filled 2022-08-25: qty 2

## 2022-08-25 MED ORDER — LIDOCAINE 2% (20 MG/ML) 5 ML SYRINGE
INTRAMUSCULAR | Status: DC | PRN
  Administered 2022-08-25: 100 mg via INTRAVENOUS

## 2022-08-25 MED ORDER — HYDROMORPHONE HCL 1 MG/ML IJ SOLN
0.5000 mg | INTRAMUSCULAR | Status: DC | PRN
  Administered 2022-08-25 – 2022-08-28 (×11): 1 mg via INTRAVENOUS
  Filled 2022-08-25 (×13): qty 1

## 2022-08-25 MED ORDER — MIDAZOLAM HCL 2 MG/2ML IJ SOLN
INTRAMUSCULAR | Status: DC | PRN
  Administered 2022-08-25: 2 mg via INTRAVENOUS

## 2022-08-25 MED ORDER — ORAL CARE MOUTH RINSE: 15.0000 mL | Freq: Once | OROMUCOSAL | Status: AC

## 2022-08-25 MED ORDER — GABAPENTIN 300 MG PO CAPS: 300.0000 mg | ORAL_CAPSULE | Freq: Once | ORAL | Status: DC

## 2022-08-25 MED ORDER — BUPIVACAINE HCL 0.5 % IJ SOLN
INTRAMUSCULAR | Status: DC | PRN
  Administered 2022-08-25: 30 mL

## 2022-08-25 MED ORDER — DEXAMETHASONE SODIUM PHOSPHATE 10 MG/ML IJ SOLN
INTRAMUSCULAR | Status: AC
  Filled 2022-08-25: qty 1

## 2022-08-25 MED ORDER — PROMETHAZINE HCL 25 MG/ML IJ SOLN: 6.2500 mg | INTRAMUSCULAR | Status: DC | PRN

## 2022-08-25 MED ORDER — DEXMEDETOMIDINE HCL IN NACL 80 MCG/20ML IV SOLN
INTRAVENOUS | Status: AC
  Filled 2022-08-25: qty 20

## 2022-08-25 MED ORDER — ONDANSETRON HCL 4 MG/2ML IJ SOLN
INTRAMUSCULAR | Status: DC | PRN
  Administered 2022-08-25: 4 mg via INTRAVENOUS

## 2022-08-25 MED ORDER — AMISULPRIDE (ANTIEMETIC) 5 MG/2ML IV SOLN: 10.0000 mg | Freq: Once | INTRAVENOUS | Status: DC | PRN

## 2022-08-25 MED ORDER — FENTANYL CITRATE (PF) 250 MCG/5ML IJ SOLN
INTRAMUSCULAR | Status: DC | PRN
  Administered 2022-08-25 (×2): 100 ug via INTRAVENOUS
  Administered 2022-08-25: 50 ug via INTRAVENOUS

## 2022-08-25 MED ORDER — LACTATED RINGERS IV SOLN: INTRAVENOUS | Status: DC | PRN

## 2022-08-25 MED ORDER — BUPIVACAINE LIPOSOME 1.3 % IJ SUSP
INTRAMUSCULAR | Status: AC
  Filled 2022-08-25: qty 20

## 2022-08-25 MED ORDER — ACETAMINOPHEN 500 MG PO TABS: 1000.0000 mg | ORAL_TABLET | Freq: Once | ORAL | Status: DC

## 2022-08-25 MED ORDER — HYDROMORPHONE HCL 1 MG/ML IJ SOLN
INTRAMUSCULAR | Status: AC
  Filled 2022-08-25: qty 0.5

## 2022-08-25 MED ORDER — BUPIVACAINE LIPOSOME 1.3 % IJ SUSP
INTRAMUSCULAR | Status: DC | PRN
  Administered 2022-08-25: 20 mL

## 2022-08-25 MED ORDER — CHLORHEXIDINE GLUCONATE 0.12 % MT SOLN
OROMUCOSAL | Status: AC
  Administered 2022-08-25: 15 mL via OROMUCOSAL
  Filled 2022-08-25: qty 15

## 2022-08-25 MED ORDER — BUPIVACAINE HCL (PF) 0.5 % IJ SOLN
INTRAMUSCULAR | Status: AC
  Filled 2022-08-25: qty 30

## 2022-08-25 MED ORDER — LACTATED RINGERS IV SOLN: INTRAVENOUS | Status: DC

## 2022-08-25 MED ORDER — MIDAZOLAM HCL 2 MG/2ML IJ SOLN
INTRAMUSCULAR | Status: AC
  Filled 2022-08-25: qty 2

## 2022-08-25 SURGICAL SUPPLY — 114 items
ADH SKN CLS APL DERMABOND .7 (GAUZE/BANDAGES/DRESSINGS)
ANTIFOG SOL W/FOAM PAD STRL (MISCELLANEOUS) ×1
APL SKNCLS STERI-STRIP NONHPOA (GAUZE/BANDAGES/DRESSINGS)
APPLIER CLIP ROT 10 11.4 M/L (STAPLE)
APR CLP MED LRG 11.4X10 (STAPLE)
BENZOIN TINCTURE PRP APPL 2/3 (GAUZE/BANDAGES/DRESSINGS) IMPLANT
BIT DRILL 7/64X5 DISP (BIT) IMPLANT
BIT DRILL BRAD RIBFIX ZI 4.0 (BIT) IMPLANT
BLADE CLIPPER SURG (BLADE) ×1 IMPLANT
BLADE SAW GIGLI 510 (BLADE) IMPLANT
CABLE ORTHO RIBFIX GUIDE (Cable) IMPLANT
CANISTER SUCT 3000ML PPV (MISCELLANEOUS) ×2 IMPLANT
CAP LOCK EXFX HEX RIBFIX (Cap) IMPLANT
CATH HYDRAGLIDE XL THORACIC (CATHETERS) IMPLANT
CATH THORACIC 28FR (CATHETERS) IMPLANT
CATH THORACIC 28FR RT ANG (CATHETERS) IMPLANT
CATH THORACIC 36FR (CATHETERS) IMPLANT
CATH THORACIC 36FR RT ANG (CATHETERS) IMPLANT
CLEANER TIP ELECTROSURG 2X2 (MISCELLANEOUS) IMPLANT
CLIP APPLIE ROT 10 11.4 M/L (STAPLE) IMPLANT
CLIP TI MEDIUM 6 (CLIP) ×1 IMPLANT
CNTNR URN SCR LID CUP LEK RST (MISCELLANEOUS) IMPLANT
CONN ST 1/4X3/8 BEN (MISCELLANEOUS) IMPLANT
CONT SPEC 4OZ STRL OR WHT (MISCELLANEOUS) ×1
COVER SURGICAL LIGHT HANDLE (MISCELLANEOUS) ×2 IMPLANT
DERMABOND ADVANCED .7 DNX12 (GAUZE/BANDAGES/DRESSINGS) IMPLANT
DRAIN CHANNEL 28F RND 3/8 FF (WOUND CARE) IMPLANT
DRAPE LAPAROSCOPIC ABDOMINAL (DRAPES) ×1 IMPLANT
DRAPE POUCH INSTRU U-SHP 10X18 (DRAPES) IMPLANT
DRAPE WARM FLUID 44X44 (DRAPES) ×1 IMPLANT
DRSG AQUACEL AG ADV 3.5X14 (GAUZE/BANDAGES/DRESSINGS) ×1 IMPLANT
DRSG COVADERM 4X10 (GAUZE/BANDAGES/DRESSINGS) IMPLANT
DRSG COVADERM 4X8 (GAUZE/BANDAGES/DRESSINGS) IMPLANT
ELECT BLADE 6.5 EXT (BLADE) ×1 IMPLANT
ELECT REM PT RETURN 9FT ADLT (ELECTROSURGICAL) ×1
ELECTRODE REM PT RTRN 9FT ADLT (ELECTROSURGICAL) ×1 IMPLANT
FELT TEFLON 1X6 (MISCELLANEOUS) ×1 IMPLANT
GAUZE 4X4 16PLY ~~LOC~~+RFID DBL (SPONGE) ×1 IMPLANT
GAUZE SPONGE 4X4 12PLY STRL (GAUZE/BANDAGES/DRESSINGS) ×1 IMPLANT
GLOVE BIO SURGEON STRL SZ 6.5 (GLOVE) IMPLANT
GLOVE BIO SURGEON STRL SZ7 (GLOVE) IMPLANT
GLOVE BIOGEL PI IND STRL 6 (GLOVE) IMPLANT
GLOVE BIOGEL PI IND STRL 7.0 (GLOVE) IMPLANT
GLOVE BIOGEL PI IND STRL 7.5 (GLOVE) IMPLANT
GLOVE SS BIOGEL STRL SZ 7.5 (GLOVE) ×1 IMPLANT
GLOVE SURG SIGNA 7.5 PF LTX (GLOVE) ×3 IMPLANT
GOWN STRL REUS W/ TWL LRG LVL3 (GOWN DISPOSABLE) ×2 IMPLANT
GOWN STRL REUS W/ TWL XL LVL3 (GOWN DISPOSABLE) ×1 IMPLANT
GOWN STRL REUS W/TWL LRG LVL3 (GOWN DISPOSABLE) ×2
GOWN STRL REUS W/TWL XL LVL3 (GOWN DISPOSABLE) ×3
HEMOSTAT POWDER SURGIFOAM 1G (HEMOSTASIS) IMPLANT
INSERT FOGARTY 61MM (MISCELLANEOUS) IMPLANT
KIT BASIN OR (CUSTOM PROCEDURE TRAY) ×1 IMPLANT
KIT SUCTION CATH 14FR (SUCTIONS) ×1 IMPLANT
KIT TURNOVER KIT B (KITS) ×1 IMPLANT
NDL HYPO 25GX1X1/2 BEV (NEEDLE) IMPLANT
NEEDLE HYPO 25GX1X1/2 BEV (NEEDLE) ×1 IMPLANT
NS IRRIG 1000ML POUR BTL (IV SOLUTION) ×3 IMPLANT
PACK CHEST (CUSTOM PROCEDURE TRAY) ×1 IMPLANT
PAD ARMBOARD 7.5X6 YLW CONV (MISCELLANEOUS) ×2 IMPLANT
PASSER SUT SWANSON 36MM LOOP (INSTRUMENTS) IMPLANT
PENCIL BUTTON HOLSTER BLD 10FT (ELECTRODE) IMPLANT
POST LOCK IFIX RIBFIX 23 (Post) IMPLANT
SEALANT SURG COSEAL 8ML (VASCULAR PRODUCTS) IMPLANT
SOL PREP POV-IOD 4OZ 10% (MISCELLANEOUS) IMPLANT
SOL SCRUB PVP POV-IOD 4OZ 7.5% (MISCELLANEOUS) ×1
SOLUTION ANTFG W/FOAM PAD STRL (MISCELLANEOUS) IMPLANT
SOLUTION SCRB POV-IOD 4OZ 7.5% (MISCELLANEOUS) IMPLANT
SPIKE FLUID TRANSFER (MISCELLANEOUS) ×1 IMPLANT
SPONGE TONSIL 1 RF SGL (DISPOSABLE) ×1 IMPLANT
STAPLER VISISTAT 35W (STAPLE) IMPLANT
STOPCOCK 4 WAY LG BORE MALE ST (IV SETS) ×1 IMPLANT
SUT FIBERWIRE #2 38 T-5 BLUE (SUTURE)
SUT FIBERWIRE #5 38 CONV NDL (SUTURE)
SUT PROLENE 1 XLH (SUTURE) ×4 IMPLANT
SUT PROLENE 2 0 MH 48 (SUTURE) IMPLANT
SUT PROLENE 2 0 SH DA (SUTURE) IMPLANT
SUT PROLENE 3 0 SH 48 (SUTURE) IMPLANT
SUT PROLENE 4 0 RB 1 (SUTURE)
SUT PROLENE 4 0 SH DA (SUTURE) IMPLANT
SUT PROLENE 4-0 RB1 .5 CRCL 36 (SUTURE) ×1 IMPLANT
SUT SILK 0 FSL (SUTURE) IMPLANT
SUT SILK 1 MH (SUTURE) ×2 IMPLANT
SUT SILK 2 0 SH (SUTURE) IMPLANT
SUT SILK 2 0SH CR/8 30 (SUTURE) ×1 IMPLANT
SUT VIC AB 1 CTX 18 (SUTURE) ×1 IMPLANT
SUT VIC AB 1 CTX 27 (SUTURE) IMPLANT
SUT VIC AB 1 CTX 36 (SUTURE) ×3
SUT VIC AB 1 CTX36XBRD ANBCTR (SUTURE) ×1 IMPLANT
SUT VIC AB 2-0 CTX 27 (SUTURE) IMPLANT
SUT VIC AB 2-0 CTX 36 (SUTURE) ×1 IMPLANT
SUT VIC AB 3-0 X1 27 (SUTURE) ×1 IMPLANT
SUT VICRYL 0 UR6 27IN ABS (SUTURE) IMPLANT
SUT VICRYL 2 TP 1 (SUTURE) ×1 IMPLANT
SUTURE FIBERWR #2 38 T-5 BLUE (SUTURE) IMPLANT
SUTURE FIBERWR #5 38 CONV NDL (SUTURE) IMPLANT
SWAB CULTURE ESWAB REG 1ML (MISCELLANEOUS) IMPLANT
SYR 10ML LL (SYRINGE) ×1 IMPLANT
SYR 20CC LL (SYRINGE) IMPLANT
SYR 20ML LL LF (SYRINGE) IMPLANT
SYR 50ML LL SCALE MARK (SYRINGE) ×1 IMPLANT
SYSTEM SAHARA CHEST DRAIN ATS (WOUND CARE) ×1 IMPLANT
TAPE CLOTH 4X10 WHT NS (GAUZE/BANDAGES/DRESSINGS) ×1 IMPLANT
TAPE CLOTH SURG 4X10 WHT LF (GAUZE/BANDAGES/DRESSINGS) IMPLANT
TOWEL GREEN STERILE (TOWEL DISPOSABLE) ×2 IMPLANT
TOWEL GREEN STERILE FF (TOWEL DISPOSABLE) ×1 IMPLANT
TRAY FOLEY MTR SLVR 16FR STAT (SET/KITS/TRAYS/PACK) IMPLANT
TRAY FOLEY SLVR 16FR LF STAT (SET/KITS/TRAYS/PACK) ×1 IMPLANT
TROCAR XCEL NON-BLD 11X100MML (ENDOMECHANICALS) IMPLANT
TUBING EXTENTION W/L.L. (IV SETS) IMPLANT
TUNNELER SHEATH ON-Q 11GX8 DSP (PAIN MANAGEMENT) IMPLANT
TUNNELER SHEATH ON-Q 16GX12 DP (PAIN MANAGEMENT) IMPLANT
WASHER ORTHO RIBFIX 9.5 (Washer) IMPLANT
WATER STERILE IRR 1000ML POUR (IV SOLUTION) ×2 IMPLANT

## 2022-08-25 NOTE — Anesthesia Procedure Notes (Signed)
Arterial Line Insertion Start/End6/05/2022 12:51 PM, 08/25/2022 12:51 PM Performed by: CRNA  Patient location: OOR procedure area. Lidocaine 1% used for infiltration Right, radial was placed Catheter size: 20 G Hand hygiene performed , maximum sterile barriers used  and Seldinger technique used Allen's test indicative of satisfactory collateral circulation Attempts: 1 Procedure performed without using ultrasound guided technique. Following insertion, dressing applied and Biopatch. Post procedure assessment: normal  Patient tolerated the procedure well with no immediate complications.

## 2022-08-25 NOTE — TOC CAGE-AID Note (Signed)
Transition of Care [facility]) - CAGE-AID Screening   Patient Details  Name: Timothy Moody MRN: [id number] Date of Birth: 1986-03-12  Transition of Care [facility]) CM/SW Contact:    Leota Sauers, RN Phone Number: 08/25/2022, 8:25 PM   Clinical Narrative:  Patient reports daily use of alcohol. Denies use of illicit drugs. TOC consult placed for SA.  CAGE-AID Screening:    Have You Ever Felt You Ought to Cut Down on Your Drinking or Drug Use?: No Have People Annoyed You By Critizing Your Drinking Or Drug Use?: No Have You Felt Bad Or Guilty About Your Drinking Or Drug Use?: No Have You Ever Had a Drink or Used Drugs First Thing In The Morning to Steady Your Nerves or to Get Rid of a Hangover?: No CAGE-AID Score: 0  Substance Abuse Education Offered: Yes  Substance abuse interventions: Other (must comment) (TOC consult for SA placed)

## 2022-08-25 NOTE — Interval H&P Note (Signed)
History and Physical Interval Note:  08/25/2022 2:39 PM  Timothy Moody  has presented today for surgery, with the diagnosis of Multiple Rib Fractures.  The various methods of treatment have been discussed with the patient and family. After consideration of risks, benefits and other options for treatment, the patient has consented to  Procedure(s): THOROCOTOMY W/RIB PLATING (Left) RIB PLATING (Left) as a surgical intervention.  The patient's history has been reviewed, patient examined, no change in status, stable for surgery.  I have reviewed the patient's chart and labs.  Questions were answered to the patient's satisfaction.     [name]

## 2022-08-25 NOTE — Brief Op Note (Addendum)
08/25/2022  5:55 PM  PATIENT:  Timothy Moody  37 y.o. male  PRE-OPERATIVE DIAGNOSIS:  Multiple Rib Fractures  POST-OPERATIVE DIAGNOSIS:  Multiple Rib Fractures  PROCEDURE:  Procedure(s): THOROCOTOMY W/RIB PLATING (Left) RIB PLATING (Left) OPEN REDUCTION and INTERNAL FIXATION OF LEFT 7-11th RIBS  SURGEON:  Surgeon(s) and Role:    * [name], MD - Primary  PHYSICIAN ASSISTANT: none  ASSISTANTS: Leward Quan, MD - Chief Resident  ANESTHESIA:   general  EBL:  75 mL   BLOOD ADMINISTERED:none  DRAINS:  32F Chest Tube(s) in the left    LOCAL MEDICATIONS USED:  30 ml 0.5% bupivacaine , 20 ml liposomal bupivacaine   SPECIMEN:  No Specimen  DISPOSITION OF SPECIMEN:  N/A  COUNTS:  YES  TOURNIQUET:  * No tourniquets in log *  DICTATION: .Other Dictation: Dictation Number -  PLAN OF CARE:  already inpatient  PATIENT DISPOSITION:  PACU - hemodynamically stable.   Delay start of Pharmacological VTE agent (>24hrs) due to surgical blood loss or risk of bleeding: yes
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
## 2022-08-25 NOTE — Anesthesia Procedure Notes (Signed)
Procedure Name: Intubation Date/Time: 08/25/2022 3:15 PM  Performed by: Aundria Rud, CRNAPre-anesthesia Checklist: Patient identified, Emergency Drugs available, Suction available and Patient being monitored Patient Re-evaluated:Patient Re-evaluated prior to induction Oxygen Delivery Method: Circle System Utilized Preoxygenation: Pre-oxygenation with 100% oxygen Induction Type: IV induction Ventilation: Mask ventilation without difficulty and Oral airway inserted - appropriate to patient size Laryngoscope Size: Mac and 4 Grade View: Grade I Tube type: Oral Endobronchial tube: Left, EBT position confirmed by auscultation, EBT position confirmed by fiberoptic bronchoscope and Double lumen EBT and 41 Fr Number of attempts: 1 Airway Equipment and Method: Stylet and Oral airway Placement Confirmation: ETT inserted through vocal cords under direct vision, positive ETCO2 and breath sounds checked- equal and bilateral Tube secured with: Tape Dental Injury: Teeth and Oropharynx as per pre-operative assessment  Comments: Easy mask with oral airway; Vivasight 41 used with grade 1 view

## 2022-08-25 NOTE — Transfer of Care (Signed)
Immediate Anesthesia Transfer of Care Note  Patient: Timothy Moody  Procedure(s) Performed: Lance Coon W/RIB PLATING (Left) RIB PLATING (Left)  Patient Location: PACU  Anesthesia Type:General  Level of Consciousness: drowsy, patient cooperative, and responds to stimulation  Airway & Oxygen Therapy: Patient Spontanous Breathing and Patient connected to face mask oxygen  Post-op Assessment: Report given to RN, Post -op Vital signs reviewed and stable, and Patient moving all extremities X 4  Post vital signs: Reviewed and stable  Last Vitals:  Vitals Value Taken Time  BP 136/81 08/25/22 1731  Temp    Pulse 109 08/25/22 1734  Resp 17 08/25/22 1734  SpO2 90 % 08/25/22 1734  Vitals shown include unvalidated device data.  Last Pain:  Vitals:   08/25/22 1230  TempSrc:   PainSc: 3          Complications: No notable events documented.
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
## 2022-08-26 ENCOUNTER — Inpatient Hospital Stay (HOSPITAL_COMMUNITY): Payer: 59

## 2022-08-26 LAB — CBC
HCT: 29.7 % — ABNORMAL LOW (ref 39.0–52.0)
Hemoglobin: 10.5 g/dL — ABNORMAL LOW (ref 13.0–17.0)
MCH: 31.4 pg (ref 26.0–34.0)
MCHC: 35.4 g/dL (ref 30.0–36.0)
MCV: 88.9 fL (ref 80.0–100.0)
Platelets: 203 10*3/uL (ref 150–400)
RBC: 3.34 MIL/uL — ABNORMAL LOW (ref 4.22–5.81)
RDW: 12 % (ref 11.5–15.5)
WBC: 9.6 10*3/uL (ref 4.0–10.5)
nRBC: 0 % (ref 0.0–0.2)

## 2022-08-26 LAB — BASIC METABOLIC PANEL
Anion gap: 8 (ref 5–15)
BUN: 5 mg/dL — ABNORMAL LOW (ref 6–20)
CO2: 26 mmol/L (ref 22–32)
Calcium: 8.1 mg/dL — ABNORMAL LOW (ref 8.9–10.3)
Chloride: 101 mmol/L (ref 98–111)
Creatinine, Ser: 0.7 mg/dL (ref 0.61–1.24)
GFR, Estimated: >60 mL/min (ref >60)
Glucose, Bld: 146 mg/dL — ABNORMAL HIGH (ref 70–99)
Potassium: 3.8 mmol/L (ref 3.5–5.1)
Sodium: 135 mmol/L (ref 135–145)

## 2022-08-26 MED ORDER — KETOROLAC TROMETHAMINE 30 MG/ML IJ SOLN
30.0000 mg | Freq: Four times a day (QID) | INTRAMUSCULAR | Status: AC
  Administered 2022-08-26 (×3): 30 mg via INTRAVENOUS
  Filled 2022-08-26 (×4): qty 1

## 2022-08-26 MED ORDER — POLYETHYLENE GLYCOL 3350 17 G PO PACK
17.0000 g | PACK | Freq: Two times a day (BID) | ORAL | Status: DC
  Administered 2022-08-26: 17 g via ORAL
  Filled 2022-08-26 (×4): qty 1

## 2022-08-26 MED ORDER — METHOCARBAMOL 750 MG PO TABS
750.0000 mg | ORAL_TABLET | Freq: Three times a day (TID) | ORAL | Status: DC
  Administered 2022-08-26 – 2022-08-28 (×6): 750 mg via ORAL
  Filled 2022-08-26 (×6): qty 1

## 2022-08-26 MED ORDER — OXYCODONE HCL 5 MG PO TABS
10.0000 mg | ORAL_TABLET | ORAL | Status: DC | PRN
  Administered 2022-08-26 – 2022-08-27 (×5): 15 mg via ORAL
  Administered 2022-08-27: 10 mg via ORAL
  Administered 2022-08-28 (×3): 15 mg via ORAL
  Filled 2022-08-26 (×3): qty 3
  Filled 2022-08-26: qty 2
  Filled 2022-08-26 (×5): qty 3

## 2022-08-26 NOTE — Anesthesia Postprocedure Evaluation (Signed)
Anesthesia Post Note  Patient: Timothy Moody  Procedure(s) Performed: THOROCOTOMY WTH RIB PLATING OF RIBS 6-10 (Left: Thoracic) RIB PLATING OF RIBS 6-10 (Left: Thoracic)     Patient location during evaluation: PACU Anesthesia Type: General Level of consciousness: sedated and patient cooperative Pain management: pain level controlled Vital Signs Assessment: post-procedure vital signs reviewed and stable Respiratory status: spontaneous breathing Cardiovascular status: stable Anesthetic complications: no   No notable events documented.  Last Vitals:  Vitals:   08/26/22 0300 08/26/22 0800  BP: (!) 161/114 133/86  Pulse: (!) 109 79  Resp: (!) 25 (!) 23  Temp: 36.9 C (!) 36.3 C  SpO2: 98% 99%    Last Pain:  Vitals:   08/26/22 0800  TempSrc: Oral  PainSc:                  Lewie Loron
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
## 2022-08-27 ENCOUNTER — Encounter (HOSPITAL_COMMUNITY): Payer: Self-pay | Admitting: Thoracic Surgery (Cardiothoracic Vascular Surgery)

## 2022-08-27 ENCOUNTER — Inpatient Hospital Stay (HOSPITAL_COMMUNITY): Payer: 59

## 2022-08-27 LAB — CBC
HCT: 29.7 % — ABNORMAL LOW (ref 39.0–52.0)
Hemoglobin: 10.3 g/dL — ABNORMAL LOW (ref 13.0–17.0)
MCH: 31.5 pg (ref 26.0–34.0)
MCHC: 34.7 g/dL (ref 30.0–36.0)
MCV: 90.8 fL (ref 80.0–100.0)
Platelets: 209 10*3/uL (ref 150–400)
RBC: 3.27 MIL/uL — ABNORMAL LOW (ref 4.22–5.81)
RDW: 12.3 % (ref 11.5–15.5)
WBC: 7.5 10*3/uL (ref 4.0–10.5)
nRBC: 0 % (ref 0.0–0.2)

## 2022-08-27 LAB — BASIC METABOLIC PANEL
Anion gap: 8 (ref 5–15)
BUN: 7 mg/dL (ref 6–20)
CO2: 26 mmol/L (ref 22–32)
Calcium: 7.9 mg/dL — ABNORMAL LOW (ref 8.9–10.3)
Chloride: 100 mmol/L (ref 98–111)
Creatinine, Ser: 0.71 mg/dL (ref 0.61–1.24)
GFR, Estimated: >60 mL/min (ref >60)
Glucose, Bld: 132 mg/dL — ABNORMAL HIGH (ref 70–99)
Potassium: 3.5 mmol/L (ref 3.5–5.1)
Sodium: 134 mmol/L — ABNORMAL LOW (ref 135–145)

## 2022-08-27 MED ORDER — KETOROLAC TROMETHAMINE 30 MG/ML IJ SOLN
30.0000 mg | Freq: Four times a day (QID) | INTRAMUSCULAR | Status: DC
  Administered 2022-08-27 – 2022-08-28 (×5): 30 mg via INTRAVENOUS
  Filled 2022-08-27 (×5): qty 1
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
## 2022-08-27 NOTE — Progress Notes (Signed)
After reading all the surgery notes re: chest tube, and seeing the labeling of the chest tube itself and what has and has not been documented. The last known documentation was Maryruth Eve on 08/25/22 at 18:54 with . So from that point to this moment 08/27/2022 at 19:01 I have documented out. I have labeled the canister at the latest level with my initials the time and date.

## 2022-08-27 NOTE — Progress Notes (Signed)
Orthopedic Tech Progress Note Patient Details:  [name] 02-23-1986 [id number] Volar was reapplied to patient's LUE.  Ortho Devices Type of Ortho Device: Volar splint Ortho Device/Splint Location: Left hand Ortho Device/Splint Interventions: Application   Post Interventions Patient Tolerated: Well  Genelle Bal Shantavia Jha 08/27/2022, 12:04 PM
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
## 2022-08-28 ENCOUNTER — Other Ambulatory Visit (HOSPITAL_COMMUNITY): Payer: Self-pay

## 2022-08-28 ENCOUNTER — Inpatient Hospital Stay (HOSPITAL_COMMUNITY): Payer: 59

## 2022-08-28 LAB — BASIC METABOLIC PANEL
Anion gap: 8 (ref 5–15)
BUN: 7 mg/dL (ref 6–20)
CO2: 29 mmol/L (ref 22–32)
Calcium: 8.5 mg/dL — ABNORMAL LOW (ref 8.9–10.3)
Chloride: 99 mmol/L (ref 98–111)
Creatinine, Ser: 0.6 mg/dL — ABNORMAL LOW (ref 0.61–1.24)
GFR, Estimated: >60 mL/min (ref >60)
Glucose, Bld: 111 mg/dL — ABNORMAL HIGH (ref 70–99)
Potassium: 3.6 mmol/L (ref 3.5–5.1)
Sodium: 136 mmol/L (ref 135–145)

## 2022-08-28 LAB — CBC
HCT: 31.4 % — ABNORMAL LOW (ref 39.0–52.0)
Hemoglobin: 11.2 g/dL — ABNORMAL LOW (ref 13.0–17.0)
MCH: 32.7 pg (ref 26.0–34.0)
MCHC: 35.7 g/dL (ref 30.0–36.0)
MCV: 91.8 fL (ref 80.0–100.0)
Platelets: 260 10*3/uL (ref 150–400)
RBC: 3.42 MIL/uL — ABNORMAL LOW (ref 4.22–5.81)
RDW: 12.4 % (ref 11.5–15.5)
WBC: 8.3 10*3/uL (ref 4.0–10.5)
nRBC: 0 % (ref 0.0–0.2)

## 2022-08-28 MED ORDER — DOCUSATE SODIUM 100 MG PO CAPS: 100.0000 mg | ORAL_CAPSULE | Freq: Two times a day (BID) | ORAL | 0 refills | Status: DC

## 2022-08-28 MED ORDER — ADULT MULTIVITAMIN W/MINERALS CH: 1.0000 | ORAL_TABLET | Freq: Every day | ORAL | Status: AC

## 2022-08-28 MED ORDER — GABAPENTIN 300 MG PO CAPS
300.0000 mg | ORAL_CAPSULE | Freq: Three times a day (TID) | ORAL | 0 refills | Status: DC | PRN
  Filled 2022-08-28: qty 90, 30d supply, fill #0

## 2022-08-28 MED ORDER — METHOCARBAMOL 500 MG PO TABS
1000.0000 mg | ORAL_TABLET | Freq: Four times a day (QID) | ORAL | 0 refills | Status: DC | PRN
  Filled 2022-08-28: qty 90, 12d supply, fill #0

## 2022-08-28 MED ORDER — VITAMIN B-1 100 MG PO TABS: 100.0000 mg | ORAL_TABLET | Freq: Every day | ORAL | Status: AC

## 2022-08-28 MED ORDER — POLYETHYLENE GLYCOL 3350 17 G PO PACK: 17.0000 g | PACK | Freq: Two times a day (BID) | ORAL | 0 refills | Status: DC | PRN

## 2022-08-28 MED ORDER — IBUPROFEN 800 MG PO TABS
800.0000 mg | ORAL_TABLET | Freq: Three times a day (TID) | ORAL | 0 refills | Status: AC | PRN
  Filled 2022-08-28: qty 10, 4d supply, fill #0

## 2022-08-28 MED ORDER — METHOCARBAMOL 500 MG PO TABS
1000.0000 mg | ORAL_TABLET | Freq: Four times a day (QID) | ORAL | Status: DC
  Administered 2022-08-28: 1000 mg via ORAL
  Filled 2022-08-28: qty 2

## 2022-08-28 MED ORDER — ACETAMINOPHEN 500 MG PO TABS: 1000.0000 mg | ORAL_TABLET | Freq: Three times a day (TID) | ORAL | 0 refills | Status: AC | PRN

## 2022-08-28 MED ORDER — TRAMADOL HCL 50 MG PO TABS
50.0000 mg | ORAL_TABLET | Freq: Four times a day (QID) | ORAL | Status: DC
  Administered 2022-08-28: 50 mg via ORAL
  Filled 2022-08-28: qty 1

## 2022-08-28 MED ORDER — FOLIC ACID 1 MG PO TABS: 1.0000 mg | ORAL_TABLET | Freq: Every day | ORAL | Status: AC

## 2022-08-28 MED ORDER — OXYCODONE HCL 10 MG PO TABS
10.0000 mg | ORAL_TABLET | ORAL | 0 refills | Status: DC | PRN
  Filled 2022-08-28: qty 30, 4d supply, fill #0

## 2022-08-28 MED ORDER — TRAMADOL HCL 50 MG PO TABS
50.0000 mg | ORAL_TABLET | Freq: Four times a day (QID) | ORAL | 0 refills | Status: DC | PRN
  Filled 2022-08-28: qty 20, 5d supply, fill #0

## 2022-08-28 NOTE — Progress Notes (Signed)
I documented and marked on collection canister 300 mL output on 08/26/22.

## 2022-08-28 NOTE — Discharge Instructions (Addendum)
Continue the splint to your left arm. Do not bear weight with your left arm (NON WEIGHT BEARING). Call for follow up with orthopedic surgery for surgery planning  RIB FRACTURES  HOME INSTRUCTIONS   PAIN CONTROL:  Pain is best controlled by a usual combination of three different methods TOGETHER:  Ice/Heat Over the counter pain medication Prescription pain medication You may experience some swelling and bruising in area of broken ribs. Ice packs or heating pads (30-60 minutes up to 6 times a day) will help. Use ice for the first few days to help decrease swelling and bruising, then switch to heat to help relax tight/sore spots and speed recovery. Some people prefer to use ice alone, heat alone, alternating between ice & heat. Experiment to what works for you. Swelling and bruising can take several weeks to resolve.  It is helpful to take an over-the-counter pain medication regularly for the first few weeks. Choose one of the following that works best for you:  Naproxen (Aleve, etc) Two 220mg  tabs twice a day Ibuprofen (Advil, etc) Three 200mg  tabs four times a day (every meal & bedtime) Acetaminophen (Tylenol, etc) 500-650mg  four times a day (every meal & bedtime) A prescription for pain medication (such as oxycodone, hydrocodone, etc) may be given to you upon discharge. Take your pain medication as prescribed.  If you are having problems/concerns with the prescription medicine (does not control pain, nausea, vomiting, rash, itching, etc), please call us [phone] to see if we need to switch you to a different pain medicine that will work better for you and/or control your side effect better. If you need a refill on your pain medication, please contact your pharmacy. They will contact our office to request authorization. Prescriptions will not be filled after 5 pm or on week-ends. Avoid getting constipated. When taking pain medications, it is common to experience some constipation. Increasing  fluid intake and taking a fiber supplement (such as Metamucil, Citrucel, FiberCon, MiraLax, etc) 1-2 times a day regularly will usually help prevent this problem from occurring. A mild laxative (prune juice, Milk of Magnesia, MiraLax, etc) should be taken according to package directions if there are no bowel movements after 48 hours.  Watch out for diarrhea. If you have many loose bowel movements, simplify your diet to bland foods & liquids for a few days. Stop any stool softeners and decrease your fiber supplement. Switching to mild anti-diarrheal medications (Kayopectate, Pepto Bismol) can help. If this worsens or does not improve, please call us. FOLLOW UP  If a follow up appointment is needed one will be scheduled for you. If none is needed with our trauma team, please follow up with your primary care provider within 2-3 weeks from discharge. Please call [facility] at [phone] if you have any questions about follow up.  If you have any orthopedic or other injuries you will need to follow up as outlined in your follow up instructions.   WHEN TO CALL us [phone]:  Poor pain control Reactions / problems with new medications (rash/itching, nausea, etc)  Fever over 101.5 F (38.5 C) Worsening swelling or bruising Worsening pain, productive cough, difficulty breathing or any other concerning symptoms  The clinic staff is available to answer your questions during regular business hours (8:30am-5pm). Please don't hesitate to call and ask to speak to one of our nurses for clinical concerns.  If you have a medical emergency, go to the nearest emergency room or call 911.  A surgeon from [employer]  [facility] is always on call at the [facility], [place]  [address], [address], [place], [place] [address] ?  MAIN: [phone] ? TOLL FREE: [phone] ?  FAX [phone]  [email or web address]      Information on Rib Fractures  A rib fracture is  a break or crack in one of the bones of the ribs. The ribs are long, curved bones that wrap around your chest and attach to your spine and your breastbone. The ribs protect your heart, lungs, and other organs in the chest. A broken or cracked rib is often painful but is not usually serious. Most rib fractures heal on their own over time. However, rib fractures can be more serious if multiple ribs are broken or if broken ribs move out of place and push against other structures or organs. What are the causes? This condition is caused by: Repetitive movements with high force, such as pitching a baseball or having severe coughing spells. A direct blow to the chest, such as a sports injury, a car accident, or a fall. Cancer that has spread to the bones, which can weaken bones and cause them to break. What are the signs or symptoms? Symptoms of this condition include: Pain when you breathe in or cough. Pain when someone presses on the injured area. Feeling short of breath. How is this diagnosed? This condition is diagnosed with a physical exam and medical history. Imaging tests may also be done, such as: Chest X-ray. CT scan. MRI. Bone scan. Chest ultrasound. How is this treated? Treatment for this condition depends on the severity of the fracture. Most rib fractures usually heal on their own in 1-3 months. Sometimes healing takes longer if there is a cough that does not stop or if there are other activities that make the injury worse (aggravating factors). While you heal, you will be given medicines to control the pain. You will also be taught deep breathing exercises. Severe injuries may require hospitalization or surgery. Follow these instructions at home: Managing pain, stiffness, and swelling If directed, apply ice to the injured area. Put ice in a plastic bag. Place a towel between your skin and the bag. Leave the ice on for 20 minutes, 2-3 times a day. Take over-the-counter and  prescription medicines only as told by your health care provider. Activity Avoid a lot of activity and any activities or movements that cause pain. Be careful during activities and avoid bumping the injured rib. Slowly increase your activity as told by your health care provider. General instructions Do deep breathing exercises as told by your health care provider. This helps prevent pneumonia, which is a common complication of a broken rib. Your health care provider may instruct you to: Take deep breaths several times a day. Try to cough several times a day, holding a pillow against the injured area. Use a device called incentive spirometer to practice deep breathing several times a day. Drink enough fluid to keep your urine pale yellow. Do not wear a rib belt or binder. These restrict breathing, which can lead to pneumonia. Keep all follow-up visits as told by your health care provider. This is important. Contact a health care provider if: You have a fever. Get help right away if: You have difficulty breathing or you are short of breath. You develop a cough that does not stop, or you cough up thick or bloody sputum. You have nausea, vomiting, or pain in your abdomen. Your pain gets  worse and medicine does not help. Summary A rib fracture is a break or crack in one of the bones of the ribs. A broken or cracked rib is often painful but is not usually serious. Most rib fractures heal on their own over time. Treatment for this condition depends on the severity of the fracture. Avoid a lot of activity and any activities or movements that cause pain. This information is not intended to replace advice given to you by your health care provider. Make sure you discuss any questions you have with your health care provider. Document Released: 03/10/2005 Document Revised: 06/09/2016 Document Reviewed: 06/09/2016 Elsevier Interactive Patient Education  2019 [employer].
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
## 2022-08-28 NOTE — TOC Transition Note (Signed)
Transition of Care [facility]) - CM/SW Discharge Note   Patient Details  Name: Timothy Moody MRN: [id number] Date of Birth: Mar 06, 1986  Transition of Care [facility]) CM/SW Contact:  Glennon Mac, RN Phone Number: 08/28/2022, 3:51 PM   Clinical Narrative:    Patient stable for discharge home today with spouse to provide needed assistance. Patient ambulating without assistive device; declines need for shower seat at this time.  No other discharge needs identified.  Final next level of care: Home/Self Care Barriers to Discharge: Barriers Resolved                           Discharge Plan and Services Additional resources added to the After Visit Summary for     Discharge Planning Services: CM Consult                                 Social Determinants of Health (SDOH) Interventions SDOH Screenings   Tobacco Use: High Risk (08/27/2022)     Readmission Risk Interventions     No data to display         Quintella Baton, RN, BSN  Trauma/Neuro ICU Case Manager [phone]
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
## 2022-08-29 ENCOUNTER — Other Ambulatory Visit: Payer: Self-pay | Admitting: Thoracic Surgery (Cardiothoracic Vascular Surgery)

## 2022-08-30 ENCOUNTER — Other Ambulatory Visit: Payer: Self-pay

## 2022-09-02 ENCOUNTER — Ambulatory Visit
Admission: RE | Admit: 2022-09-02 | Discharge: 2022-09-02 | Disposition: A | Discharge status: Home / Self Care | Payer: 59 | Source: Ambulatory Visit | Attending: Thoracic Surgery (Cardiothoracic Vascular Surgery) | Admitting: Thoracic Surgery (Cardiothoracic Vascular Surgery)

## 2022-09-02 ENCOUNTER — Ambulatory Visit (INDEPENDENT_AMBULATORY_CARE_PROVIDER_SITE_OTHER): Payer: Self-pay | Admitting: Physician Assistant

## 2022-09-02 DIAGNOSIS — J939 Pneumothorax, unspecified: Secondary | ICD-10-CM | POA: Diagnosis not present

## 2022-09-02 DIAGNOSIS — J90 Pleural effusion, not elsewhere classified: Secondary | ICD-10-CM | POA: Diagnosis not present

## 2022-09-02 DIAGNOSIS — S2243XA Multiple fractures of ribs, bilateral, initial encounter for closed fracture: Secondary | ICD-10-CM

## 2022-09-02 NOTE — Patient Instructions (Addendum)
Do not lift more than 5-10 lbs Do not drive until you follow up with Dr. Dorris Fetch in 2 weeks You may shower, wash the incision gently with soap and water, pat dry Do not submerge in water
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
## 2022-09-03 DIAGNOSIS — S52502A Unspecified fracture of the lower end of left radius, initial encounter for closed fracture: Secondary | ICD-10-CM | POA: Diagnosis not present

## 2022-09-04 ENCOUNTER — Encounter (HOSPITAL_COMMUNITY)
Admission: AD | Disposition: A | Discharge status: Home / Self Care | Payer: Self-pay | Source: Ambulatory Visit | Attending: Orthopedic Surgery

## 2022-09-04 ENCOUNTER — Ambulatory Visit (HOSPITAL_COMMUNITY)
Admission: AD | Admit: 2022-09-04 | Discharge: 2022-09-04 | Disposition: A | Discharge status: Home / Self Care | Payer: 59 | Source: Ambulatory Visit | Attending: Orthopedic Surgery | Admitting: Orthopedic Surgery

## 2022-09-04 ENCOUNTER — Other Ambulatory Visit: Payer: Self-pay

## 2022-09-04 ENCOUNTER — Ambulatory Visit (HOSPITAL_COMMUNITY): Payer: 59

## 2022-09-04 ENCOUNTER — Encounter (HOSPITAL_COMMUNITY): Payer: Self-pay | Admitting: Orthopedic Surgery

## 2022-09-04 ENCOUNTER — Ambulatory Visit (HOSPITAL_BASED_OUTPATIENT_CLINIC_OR_DEPARTMENT_OTHER): Payer: 59 | Admitting: Anesthesiology

## 2022-09-04 ENCOUNTER — Ambulatory Visit (HOSPITAL_COMMUNITY): Payer: 59 | Admitting: Anesthesiology

## 2022-09-04 DIAGNOSIS — D649 Anemia, unspecified: Secondary | ICD-10-CM | POA: Diagnosis not present

## 2022-09-04 DIAGNOSIS — S62102D Fracture of unspecified carpal bone, left wrist, subsequent encounter for fracture with routine healing: Secondary | ICD-10-CM | POA: Diagnosis not present

## 2022-09-04 DIAGNOSIS — S52572A Other intraarticular fracture of lower end of left radius, initial encounter for closed fracture: Secondary | ICD-10-CM | POA: Diagnosis not present

## 2022-09-04 DIAGNOSIS — S62102A Fracture of unspecified carpal bone, left wrist, initial encounter for closed fracture: Secondary | ICD-10-CM | POA: Insufficient documentation

## 2022-09-04 HISTORY — DX: Other specified health status: Z78.9

## 2022-09-04 HISTORY — PX: ORIF WRIST FRACTURE: SHX2133

## 2022-09-04 SURGERY — OPEN REDUCTION INTERNAL FIXATION (ORIF) WRIST FRACTURE
Anesthesia: Monitor Anesthesia Care | Site: Wrist | Laterality: Left

## 2022-09-04 MED ORDER — ACETAMINOPHEN 500 MG PO TABS
1000.0000 mg | ORAL_TABLET | Freq: Once | ORAL | Status: AC
  Administered 2022-09-04: 1000 mg via ORAL
  Filled 2022-09-04: qty 2

## 2022-09-04 MED ORDER — LIDOCAINE HCL 1 % IJ SOLN
INTRAMUSCULAR | Status: AC
  Filled 2022-09-04: qty 20

## 2022-09-04 MED ORDER — 0.9 % SODIUM CHLORIDE (POUR BTL) OPTIME
TOPICAL | Status: DC | PRN
  Administered 2022-09-04: 1000 mL

## 2022-09-04 MED ORDER — CEFAZOLIN SODIUM-DEXTROSE 2-4 GM/100ML-% IV SOLN
INTRAVENOUS | Status: AC
  Filled 2022-09-04: qty 100

## 2022-09-04 MED ORDER — MIDAZOLAM HCL 2 MG/2ML IJ SOLN
INTRAMUSCULAR | Status: AC
  Administered 2022-09-04: 2 mg
  Filled 2022-09-04: qty 2

## 2022-09-04 MED ORDER — PROPOFOL 500 MG/50ML IV EMUL
INTRAVENOUS | Status: DC | PRN
  Administered 2022-09-04: 20 mg via INTRAVENOUS
  Administered 2022-09-04: 120 ug/kg/min via INTRAVENOUS

## 2022-09-04 MED ORDER — BACITRACIN ZINC 500 UNIT/GM EX OINT
TOPICAL_OINTMENT | CUTANEOUS | Status: DC | PRN
  Administered 2022-09-04: 1 via TOPICAL

## 2022-09-04 MED ORDER — BACITRACIN ZINC 500 UNIT/GM EX OINT
TOPICAL_OINTMENT | CUTANEOUS | Status: AC
  Filled 2022-09-04: qty 28.35

## 2022-09-04 MED ORDER — FENTANYL CITRATE (PF) 100 MCG/2ML IJ SOLN
INTRAMUSCULAR | Status: AC
  Administered 2022-09-04: 100 ug
  Filled 2022-09-04: qty 2

## 2022-09-04 MED ORDER — OXYCODONE HCL 5 MG PO TABS: 5.0000 mg | ORAL_TABLET | Freq: Four times a day (QID) | ORAL | 0 refills | Status: DC | PRN

## 2022-09-04 MED ORDER — CHLORHEXIDINE GLUCONATE 0.12 % MT SOLN: 15.0000 mL | Freq: Once | OROMUCOSAL | Status: AC

## 2022-09-04 MED ORDER — DEXAMETHASONE SODIUM PHOSPHATE 10 MG/ML IJ SOLN
INTRAMUSCULAR | Status: DC | PRN
  Administered 2022-09-04: 10 mg

## 2022-09-04 MED ORDER — CHLORHEXIDINE GLUCONATE 0.12 % MT SOLN
OROMUCOSAL | Status: AC
  Administered 2022-09-04: 15 mL via OROMUCOSAL
  Filled 2022-09-04: qty 15

## 2022-09-04 MED ORDER — CEFAZOLIN SODIUM-DEXTROSE 2-4 GM/100ML-% IV SOLN
2.0000 g | INTRAVENOUS | Status: AC
  Administered 2022-09-04: 2 g via INTRAVENOUS

## 2022-09-04 MED ORDER — LACTATED RINGERS IV SOLN: INTRAVENOUS | Status: DC

## 2022-09-04 MED ORDER — FENTANYL CITRATE (PF) 250 MCG/5ML IJ SOLN
INTRAMUSCULAR | Status: AC
  Filled 2022-09-04: qty 5

## 2022-09-04 MED ORDER — ROPIVACAINE HCL 5 MG/ML IJ SOLN
INTRAMUSCULAR | Status: DC | PRN
  Administered 2022-09-04: 25 mL via PERINEURAL

## 2022-09-04 MED ORDER — ORAL CARE MOUTH RINSE: 15.0000 mL | Freq: Once | OROMUCOSAL | Status: AC

## 2022-09-04 SURGICAL SUPPLY — 69 items
BAG COUNTER SPONGE SURGICOUNT (BAG) ×1 IMPLANT
BAG SPNG CNTER NS LX DISP (BAG)
BIT DRILL 2.2 SS TIBIAL (BIT) IMPLANT
BLADE CLIPPER SURG (BLADE) IMPLANT
BNDG CMPR 5X3 KNIT ELC UNQ LF (GAUZE/BANDAGES/DRESSINGS) ×1
BNDG CMPR 5X4 KNIT ELC UNQ LF (GAUZE/BANDAGES/DRESSINGS) ×1
BNDG CMPR 9X4 STRL LF SNTH (GAUZE/BANDAGES/DRESSINGS) ×1
BNDG ELASTIC 3INX 5YD STR LF (GAUZE/BANDAGES/DRESSINGS) ×1 IMPLANT
BNDG ELASTIC 4INX 5YD STR LF (GAUZE/BANDAGES/DRESSINGS) IMPLANT
BNDG ELASTIC 4X5.8 VLCR STR LF (GAUZE/BANDAGES/DRESSINGS) ×1 IMPLANT
BNDG ESMARK 4X9 LF (GAUZE/BANDAGES/DRESSINGS) ×1 IMPLANT
BNDG GAUZE DERMACEA FLUFF 4 (GAUZE/BANDAGES/DRESSINGS) ×1 IMPLANT
BNDG GZE DERMACEA 4 6PLY (GAUZE/BANDAGES/DRESSINGS)
CANISTER SUCT 3000ML PPV (MISCELLANEOUS) ×1 IMPLANT
CORD BIPOLAR FORCEPS 12FT (ELECTRODE) ×1 IMPLANT
COVER SURGICAL LIGHT HANDLE (MISCELLANEOUS) ×1 IMPLANT
CUFF TOURN SGL QUICK 18X4 (TOURNIQUET CUFF) ×1 IMPLANT
CUFF TOURN SGL QUICK 24 (TOURNIQUET CUFF)
CUFF TRNQT CYL 24X4X16.5-23 (TOURNIQUET CUFF) IMPLANT
DRAPE OEC MINIVIEW 54X84 (DRAPES) ×1 IMPLANT
DRAPE SURG 17X23 STRL (DRAPES) ×1 IMPLANT
DRSG ADAPTIC 3X8 NADH LF (GAUZE/BANDAGES/DRESSINGS) ×1 IMPLANT
DRSG EMULSION OIL 3X3 NADH (GAUZE/BANDAGES/DRESSINGS) ×1 IMPLANT
GAUZE SPONGE 4X4 12PLY STRL (GAUZE/BANDAGES/DRESSINGS) ×1 IMPLANT
GLOVE BIO SURGEON STRL SZ 6 (GLOVE) IMPLANT
GLOVE BIO SURGEON STRL SZ7.5 (GLOVE) ×1 IMPLANT
GLOVE BIOGEL PI IND STRL 6.5 (GLOVE) IMPLANT
GLOVE BIOGEL PI IND STRL 7.5 (GLOVE) ×1 IMPLANT
GLOVE SURG SS PI 7.0 STRL IVOR (GLOVE) IMPLANT
GOWN STRL REUS W/ TWL LRG LVL3 (GOWN DISPOSABLE) ×2 IMPLANT
GOWN STRL REUS W/TWL LRG LVL3 (GOWN DISPOSABLE) ×2
HIBICLENS CHG 4% 4OZ BTL (MISCELLANEOUS) ×1 IMPLANT
K-WIRE 1.6 (WIRE) ×2
K-WIRE FX5X1.6XNS BN SS (WIRE) ×2
KIT BASIN OR (CUSTOM PROCEDURE TRAY) ×1 IMPLANT
KIT TURNOVER KIT B (KITS) ×1 IMPLANT
KWIRE FX5X1.6XNS BN SS (WIRE) IMPLANT
MANIFOLD NEPTUNE II (INSTRUMENTS) ×1 IMPLANT
NDL HYPO 25GX1X1/2 BEV (NEEDLE) ×1 IMPLANT
NEEDLE HYPO 25GX1X1/2 BEV (NEEDLE) ×1 IMPLANT
NS IRRIG 1000ML POUR BTL (IV SOLUTION) ×1 IMPLANT
PACK ORTHO EXTREMITY (CUSTOM PROCEDURE TRAY) ×1 IMPLANT
PAD ARMBOARD 7.5X6 YLW CONV (MISCELLANEOUS) ×2 IMPLANT
PAD CAST 3X4 CTTN HI CHSV (CAST SUPPLIES) ×1 IMPLANT
PAD CAST 4YDX4 CTTN HI CHSV (CAST SUPPLIES) ×1 IMPLANT
PADDING CAST COTTON 3X4 STRL (CAST SUPPLIES) ×1
PADDING CAST COTTON 4X4 STRL (CAST SUPPLIES) ×1
PEG LOCKING SMOOTH 2.2X16 (Screw) ×2 IMPLANT
PEG LOCKING SMOOTH 2.2X18 (Peg) IMPLANT
PEG LOCKING SMOOTH 2.2X20 (Screw) IMPLANT
PLATE STD DVR LEFT (Plate) ×1 IMPLANT
PLATE STD DVR LT 24X55 (Plate) IMPLANT
SCREW LOCK 16X2.7X 3 LD TPR (Screw) IMPLANT
SCREW LOCKING 2.7X16 (Screw) ×3 IMPLANT
SCREW LP NL 2.7X16MM (Screw) IMPLANT
SCREW NONLOCK 2.7X18MM (Screw) IMPLANT
SLING ARM FOAM STRAP LRG (SOFTGOODS) IMPLANT
SPLINT FIBERGLASS 3X12 (CAST SUPPLIES) IMPLANT
SUT ETHILON 4 0 PS 2 18 (SUTURE) ×1 IMPLANT
SUT MAXBRAID #2 CVD NDL (SUTURE) IMPLANT
SUT VIC AB 2-0 CT1 27 (SUTURE) ×1
SUT VIC AB 2-0 CT1 TAPERPNT 27 (SUTURE) IMPLANT
SUT VICRYL RAPIDE 4/0 PS 2 (SUTURE) IMPLANT
SYR CONTROL 10ML LL (SYRINGE) IMPLANT
TOWEL GREEN STERILE (TOWEL DISPOSABLE) ×1 IMPLANT
TOWEL GREEN STERILE FF (TOWEL DISPOSABLE) ×1 IMPLANT
TUBE CONNECTING 12X1/4 (SUCTIONS) ×1 IMPLANT
UNDERPAD 30X36 HEAVY ABSORB (UNDERPADS AND DIAPERS) ×1 IMPLANT
WATER STERILE IRR 1000ML POUR (IV SOLUTION) ×1 IMPLANT

## 2022-09-04 NOTE — H&P (Signed)
Preoperative History & Physical Exam  Surgeon: [name], MD  Diagnosis: Left wrist fracture  Planned Procedure: Procedure(s) (LRB): OPEN REDUCTION INTERNAL FIXATION (ORIF) WRIST FRACTURE (Left)  History of Present Illness:   Patient is a 37 y.o. male with symptoms consistent with Left wrist fracture who presents for surgical intervention. The risks, benefits and alternatives of surgical intervention were discussed and informed consent was obtained prior to surgery.  Past Medical History: No past medical history on file.  Past Surgical History:  Past Surgical History:  Procedure Laterality Date   RIB PLATING Left 08/25/2022   Procedure: RIB PLATING OF RIBS 6-10;  Surgeon: [name], MD;  Location: [facility] OR;  Service: Thoracic;  Laterality: Left;   THOROCOTOMY W/RIB PLATING Left 08/25/2022   Procedure: THOROCOTOMY WTH RIB PLATING OF RIBS 6-10;  Surgeon: [name], MD;  Location: [facility] OR;  Service: Thoracic;  Laterality: Left;    Medications:  Prior to Admission medications   Medication Sig Start Date End Date Taking? Authorizing Provider  acetaminophen (TYLENOL) 500 MG tablet Take 2 tablets (1,000 mg total) by mouth every 8 (eight) hours as needed. 08/28/22   Maczis, Elmer Sow, PA-C  docusate sodium (COLACE) 100 MG capsule Take 1 capsule (100 mg total) by mouth 2 (two) times daily. 08/28/22   Maczis, Elmer Sow, PA-C  folic acid (FOLVITE) 1 MG tablet Take 1 tablet (1 mg total) by mouth daily. 08/29/22   Maczis, Elmer Sow, PA-C  gabapentin (NEURONTIN) 300 MG capsule Take 1 capsule (300 mg total) by mouth 3 (three) times daily as needed. 08/28/22   Maczis, Elmer Sow, PA-C  ibuprofen (ADVIL) 800 MG tablet Take 1 tablet (800 mg total) by mouth every 8 (eight) hours as needed for mild pain. 08/28/22   Maczis, Elmer Sow, PA-C  methocarbamol (ROBAXIN) 500 MG tablet Take 2 tablets (1,000 mg total) by mouth every 6 (six) hours as needed for muscle spasms. 08/28/22   Maczis, Elmer Sow,  PA-C  Multiple Vitamin (MULTIVITAMIN WITH MINERALS) TABS tablet Take 1 tablet by mouth daily. 08/29/22   Maczis, Elmer Sow, PA-C  Oxycodone HCl 10 MG TABS Take 1 to 1.5 tablets (10-15 mg total) by mouth every 4 (four) hours as needed for breakthrough pain. 08/28/22   Maczis, Elmer Sow, PA-C  thiamine (VITAMIN B-1) 100 MG tablet Take 1 tablet (100 mg total) by mouth daily. 08/29/22   Maczis, Elmer Sow, PA-C  traMADol (ULTRAM) 50 MG tablet Take 1 tablet (50 mg total) by mouth every 6 (six) hours as needed. 08/28/22   Maczis, Elmer Sow, PA-C    Allergies:  Patient has no known allergies.  Review of Systems: Negative except per HPI.  Physical Exam: Alert and oriented, NAD Head and neck: no masses, normal alignment CV: pulse intact Pulm: no increased work of breathing, respirations even and unlabored Abdomen: non-distended Extremities: extremities warm and well perfused  LABS: Recent Results (from the past 2160 hour(s))  Comprehensive metabolic panel     Status: Abnormal   Collection Time: 08/22/22  5:05 PM  Result Value Ref Range   Sodium 140 135 - 145 mmol/L   Potassium 3.2 (L) 3.5 - 5.1 mmol/L   Chloride 104 98 - 111 mmol/L   CO2 24 22 - 32 mmol/L   Glucose, Bld 140 (H) 70 - 99 mg/dL    Comment: Glucose reference range applies only to samples taken after fasting for at least 8 hours.   BUN 9 6 - 20 mg/dL  Creatinine, Ser 0.99 0.61 - 1.24 mg/dL   Calcium 8.9 8.9 - 78.2 mg/dL   Total Protein 7.4 6.5 - 8.1 g/dL   Albumin 4.2 3.5 - 5.0 g/dL   AST 38 15 - 41 U/L   ALT 27 0 - 44 U/L   Alkaline Phosphatase 74 38 - 126 U/L   Total Bilirubin 0.6 0.3 - 1.2 mg/dL   GFR, Estimated >95 >62 mL/min    Comment: (NOTE) Calculated using the CKD-EPI Creatinine Equation (2021)    Anion gap 12 5 - 15    Comment: Performed at [facility] Lab, [address], [place], [place] [address]  CBC     Status: Abnormal   Collection Time: 08/22/22  5:05 PM  Result Value Ref Range   WBC 17.4 (H) 4.0 - 10.5  K/uL   RBC 4.21 (L) 4.22 - 5.81 MIL/uL   Hemoglobin 13.2 13.0 - 17.0 g/dL   HCT 57.8 (L) 46.9 - 62.9 %   MCV 92.4 80.0 - 100.0 fL   MCH 31.4 26.0 - 34.0 pg   MCHC 33.9 30.0 - 36.0 g/dL   RDW 52.8 41.3 - 24.4 %   Platelets 282 150 - 400 K/uL   nRBC 0.0 0.0 - 0.2 %    Comment: Performed at [facility] Lab, [address], [place], [place] [address]  Ethanol     Status: Abnormal   Collection Time: 08/22/22  5:05 PM  Result Value Ref Range   Alcohol, Ethyl (B) 113 (H) <10 mg/dL    Comment: (NOTE) Lowest detectable limit for serum alcohol is 10 mg/dL.  For medical purposes only. Performed at [facility], [address] [place], [place] [address]   Lactic acid, plasma     Status: Abnormal   Collection Time: 08/22/22  5:05 PM  Result Value Ref Range   Lactic Acid, Venous 3.1 (HH) 0.5 - 1.9 mmol/L    Comment: CRITICAL RESULT CALLED TO, READ BACK BY AND VERIFIED WITH [place], RN @ [phone] 08/22/22 BY [facility] Performed at [facility] Lab, [address] [place], [place] [address]   Sample to Blood Bank     Status: None   Collection Time: 08/22/22  5:05 PM  Result Value Ref Range   Blood Bank Specimen SAMPLE AVAILABLE FOR TESTING    Sample Expiration      08/25/2022,2359 Performed at [facility] Lab, [address], [place], [place] [address]   ABO/Rh     Status: None   Collection Time: 08/22/22  5:05 PM  Result Value Ref Range   ABO/RH(D)      O NEG Performed at [facility], [address], [place], [place] [address]   I-Stat Chem 8, ED     Status: Abnormal   Collection Time: 08/22/22  5:13 PM  Result Value Ref Range   Sodium 144 135 - 145 mmol/L   Potassium 3.3 (L) 3.5 - 5.1 mmol/L   Chloride 105 98 - 111 mmol/L   BUN 11 6 - 20 mg/dL   Creatinine, Ser 3.29 0.61 - 1.24 mg/dL   Glucose, Bld 518 (H) 70 - 99 mg/dL    Comment: Glucose reference range applies only to samples taken after fasting for at least 8 hours.   Calcium, Ion 1.16 1.15 - 1.40 mmol/L    TCO2 28 22 - 32 mmol/L   Hemoglobin 15.0 13.0 - 17.0 g/dL   HCT 84.1 66.0 - 63.0 %  MRSA  Next Gen by PCR, Nasal     Status: None   Collection Time: 08/22/22  9:51 PM   Specimen: Nasal Mucosa; Nasal Swab  Result Value Ref Range   MRSA by PCR Next Gen NOT DETECTED NOT DETECTED    Comment: (NOTE) The GeneXpert MRSA Assay (FDA approved for NASAL specimens only), is one component of a comprehensive MRSA colonization surveillance program. It is not intended to diagnose MRSA infection nor to guide or monitor treatment for MRSA infections. Test performance is not FDA approved in patients less than 66 years old. Performed at [facility] Lab, [address] [place], [place] [address]   Protime-INR     Status: None   Collection Time: 08/22/22 10:56 PM  Result Value Ref Range   Prothrombin Time 14.9 11.4 - 15.2 seconds   INR 1.2 0.8 - 1.2    Comment: (NOTE) INR goal varies based on device and disease states. Performed at [facility] Lab, [address], [place], [place] [address]   HIV Antibody (routine testing w rflx)     Status: None   Collection Time: 08/22/22 10:56 PM  Result Value Ref Range   HIV Screen 4th Generation wRfx Non Reactive Non Reactive    Comment: Performed at [facility] Lab, [address] [place], [place] [address]  CBC     Status: Abnormal   Collection Time: 08/22/22 10:56 PM  Result Value Ref Range   WBC 14.2 (H) 4.0 - 10.5 K/uL   RBC 3.85 (L) 4.22 - 5.81 MIL/uL   Hemoglobin 12.0 (L) 13.0 - 17.0 g/dL   HCT 43.3 (L) 29.5 - 18.8 %   MCV 92.2 80.0 - 100.0 fL   MCH 31.2 26.0 - 34.0 pg   MCHC 33.8 30.0 - 36.0 g/dL   RDW 41.6 60.6 - 30.1 %   Platelets 228 150 - 400 K/uL   nRBC 0.0 0.0 - 0.2 %    Comment: Performed at [facility] Lab, [address] [place], [place] [address]  Glucose, capillary     Status: Abnormal   Collection Time: 08/22/22 11:19 PM  Result Value Ref Range   Glucose-Capillary 136 (H) 70 - 99 mg/dL    Comment: Glucose  reference range applies only to samples taken after fasting for at least 8 hours.  Urinalysis, Routine w reflex microscopic -Urine, Clean Catch     Status: Abnormal   Collection Time: 08/23/22  6:30 AM  Result Value Ref Range   Color, Urine YELLOW YELLOW   APPearance CLEAR CLEAR   Specific Gravity, Urine 1.026 1.005 - 1.030   pH 5.0 5.0 - 8.0   Glucose, UA NEGATIVE NEGATIVE mg/dL   Hgb urine dipstick NEGATIVE NEGATIVE   Bilirubin Urine NEGATIVE NEGATIVE   Ketones, ur 5 (A) NEGATIVE mg/dL   Protein, ur NEGATIVE NEGATIVE mg/dL   Nitrite NEGATIVE NEGATIVE   Leukocytes,Ua NEGATIVE NEGATIVE    Comment: Performed at [facility] Lab, [address], [place], [place] [address]  Basic metabolic panel     Status: Abnormal   Collection Time: 08/23/22  8:03 AM  Result Value Ref Range   Sodium 137 135 - 145 mmol/L   Potassium 3.7 3.5 - 5.1 mmol/L   Chloride 105 98 - 111 mmol/L   CO2 23 22 - 32 mmol/L   Glucose, Bld 135 (H) 70 - 99 mg/dL    Comment: Glucose reference range applies only to samples taken after fasting for at least 8 hours.  BUN 9 6 - 20 mg/dL   Creatinine, Ser 2.84 0.61 - 1.24 mg/dL   Calcium 7.9 (L) 8.9 - 10.3 mg/dL   GFR, Estimated >13 >24 mL/min    Comment: (NOTE) Calculated using the CKD-EPI Creatinine Equation (2021)    Anion gap 9 5 - 15    Comment: Performed at [facility] Lab, [address], [place], [place] [address]  CBC     Status: Abnormal   Collection Time: 08/23/22  8:03 AM  Result Value Ref Range   WBC 8.0 4.0 - 10.5 K/uL   RBC 3.63 (L) 4.22 - 5.81 MIL/uL   Hemoglobin 11.4 (L) 13.0 - 17.0 g/dL   HCT 72.5 (L) 36.6 - 44.0 %   MCV 92.3 80.0 - 100.0 fL   MCH 31.4 26.0 - 34.0 pg   MCHC 34.0 30.0 - 36.0 g/dL   RDW 34.7 42.5 - 95.6 %   Platelets 201 150 - 400 K/uL   nRBC 0.0 0.0 - 0.2 %    Comment: Performed at [facility] Lab, [address] [place], [place] [address]  CBC     Status: Abnormal   Collection Time: 08/23/22  2:16 PM  Result Value  Ref Range   WBC 8.7 4.0 - 10.5 K/uL   RBC 3.62 (L) 4.22 - 5.81 MIL/uL   Hemoglobin 11.6 (L) 13.0 - 17.0 g/dL   HCT 43.3 (L) 29.5 - 18.8 %   MCV 91.7 80.0 - 100.0 fL   MCH 32.0 26.0 - 34.0 pg   MCHC 34.9 30.0 - 36.0 g/dL   RDW 41.6 60.6 - 30.1 %   Platelets 188 150 - 400 K/uL   nRBC 0.0 0.0 - 0.2 %    Comment: Performed at [facility] Lab, [address], [place], [place] [address]  CBC     Status: Abnormal   Collection Time: 08/23/22 10:40 PM  Result Value Ref Range   WBC 7.8 4.0 - 10.5 K/uL   RBC 3.44 (L) 4.22 - 5.81 MIL/uL   Hemoglobin 11.1 (L) 13.0 - 17.0 g/dL   HCT 32.3 (L) 55.7 - 32.2 %   MCV 94.2 80.0 - 100.0 fL   MCH 32.3 26.0 - 34.0 pg   MCHC 34.3 30.0 - 36.0 g/dL   RDW 02.5 42.7 - 06.2 %   Platelets 177 150 - 400 K/uL   nRBC 0.0 0.0 - 0.2 %    Comment: Performed at [facility] Lab, [address], [place], [place] [address]  CBC     Status: Abnormal   Collection Time: 08/24/22  6:14 AM  Result Value Ref Range   WBC 8.1 4.0 - 10.5 K/uL   RBC 3.30 (L) 4.22 - 5.81 MIL/uL   Hemoglobin 10.3 (L) 13.0 - 17.0 g/dL   HCT 31.5 (L) 17.6 - 16.0 %   MCV 91.5 80.0 - 100.0 fL   MCH 31.2 26.0 - 34.0 pg   MCHC 34.1 30.0 - 36.0 g/dL   RDW 73.7 10.6 - 26.9 %   Platelets 164 150 - 400 K/uL   nRBC 0.0 0.0 - 0.2 %    Comment: Performed at [facility] Lab, [address] [place], [place] [address]  Basic metabolic panel     Status: Abnormal   Collection Time: 08/24/22  6:14 AM  Result Value Ref Range   Sodium 136 135 - 145 mmol/L   Potassium 3.9 3.5 - 5.1 mmol/L   Chloride 104 98 - 111 mmol/L  CO2 24 22 - 32 mmol/L   Glucose, Bld 112 (H) 70 - 99 mg/dL    Comment: Glucose reference range applies only to samples taken after fasting for at least 8 hours.   BUN <5 (L) 6 - 20 mg/dL   Creatinine, Ser 1.61 0.61 - 1.24 mg/dL   Calcium 7.9 (L) 8.9 - 10.3 mg/dL   GFR, Estimated >09 >60 mL/min    Comment: (NOTE) Calculated using the CKD-EPI Creatinine Equation (2021)     Anion gap 8 5 - 15    Comment: Performed at [facility] Lab, [address], [place], [place] [address]  CBC     Status: Abnormal   Collection Time: 08/24/22  3:10 PM  Result Value Ref Range   WBC 7.1 4.0 - 10.5 K/uL   RBC 3.39 (L) 4.22 - 5.81 MIL/uL   Hemoglobin 10.5 (L) 13.0 - 17.0 g/dL   HCT 81.1 (L) 91.4 - 78.2 %   MCV 91.7 80.0 - 100.0 fL   MCH 31.0 26.0 - 34.0 pg   MCHC 33.8 30.0 - 36.0 g/dL   RDW 95.6 21.3 - 08.6 %   Platelets 158 150 - 400 K/uL   nRBC 0.0 0.0 - 0.2 %    Comment: Performed at [facility] Lab, [address] [place], [place] [address]  Type and screen [facility]     Status: None   Collection Time: 08/24/22  7:55 PM  Result Value Ref Range   ABO/RH(D) O NEG    Antibody Screen NEG    Sample Expiration      08/27/2022,2359 Performed at [facility] Lab, [address], [place], [place] [address]   CBC     Status: Abnormal   Collection Time: 08/24/22  9:57 PM  Result Value Ref Range   WBC 7.3 4.0 - 10.5 K/uL   RBC 3.46 (L) 4.22 - 5.81 MIL/uL   Hemoglobin 11.1 (L) 13.0 - 17.0 g/dL   HCT 28.4 (L) 13.2 - 44.0 %   MCV 91.6 80.0 - 100.0 fL   MCH 32.1 26.0 - 34.0 pg   MCHC 35.0 30.0 - 36.0 g/dL   RDW 10.2 72.5 - 36.6 %   Platelets 165 150 - 400 K/uL   nRBC 0.0 0.0 - 0.2 %    Comment: Performed at [facility], [address], [place], [place] [address]  SARS Coronavirus 2 by RT PCR (hospital order, performed in [facility] hospital lab) *cepheid single result test* Anterior Nasal Swab     Status: None   Collection Time: 08/25/22  7:14 AM   Specimen: Anterior Nasal Swab  Result Value Ref Range   SARS Coronavirus 2 by RT PCR NEGATIVE NEGATIVE    Comment: Performed at [facility] Lab, [address] [place], [place] [address]  CBC     Status: Abnormal   Collection Time: 08/25/22  9:37 AM  Result Value Ref Range   WBC 9.0 4.0 - 10.5 K/uL   RBC 3.46 (L) 4.22 - 5.81 MIL/uL   Hemoglobin 10.8 (L) 13.0 - 17.0 g/dL   HCT 56.3  (L) 87.5 - 52.0 %   MCV 91.9 80.0 - 100.0 fL   MCH 31.2 26.0 - 34.0 pg   MCHC 34.0 30.0 - 36.0 g/dL   RDW 64.3 32.9 - 51.8 %   Platelets 174 150 - 400 K/uL   nRBC 0.0 0.0 - 0.2 %    Comment: Performed at [facility], [address] [place], [place] [address]  CBC     Status: Abnormal   Collection Time: 08/26/22  4:29 AM  Result Value Ref Range   WBC 9.6 4.0 - 10.5 K/uL   RBC 3.34 (L) 4.22 - 5.81 MIL/uL   Hemoglobin 10.5 (L) 13.0 - 17.0 g/dL   HCT 60.4 (L) 54.0 - 98.1 %   MCV 88.9 80.0 - 100.0 fL   MCH 31.4 26.0 - 34.0 pg   MCHC 35.4 30.0 - 36.0 g/dL   RDW 19.1 47.8 - 29.5 %   Platelets 203 150 - 400 K/uL   nRBC 0.0 0.0 - 0.2 %    Comment: Performed at [facility], [address] [place], [place] [address]  Basic metabolic panel     Status: Abnormal   Collection Time: 08/26/22  4:29 AM  Result Value Ref Range   Sodium 135 135 - 145 mmol/L   Potassium 3.8 3.5 - 5.1 mmol/L   Chloride 101 98 - 111 mmol/L   CO2 26 22 - 32 mmol/L   Glucose, Bld 146 (H) 70 - 99 mg/dL    Comment: Glucose reference range applies only to samples taken after fasting for at least 8 hours.   BUN 5 (L) 6 - 20 mg/dL   Creatinine, Ser 8.65 0.61 - 1.24 mg/dL   Calcium 8.1 (L) 8.9 - 10.3 mg/dL   GFR, Estimated >78 >46 mL/min    Comment: (NOTE) Calculated using the CKD-EPI Creatinine Equation (2021)    Anion gap 8 5 - 15    Comment: Performed at [facility], [address], [place], [place] [address]  CBC     Status: Abnormal   Collection Time: 08/27/22  3:47 AM  Result Value Ref Range   WBC 7.5 4.0 - 10.5 K/uL   RBC 3.27 (L) 4.22 - 5.81 MIL/uL   Hemoglobin 10.3 (L) 13.0 - 17.0 g/dL   HCT 28.4 (L) 13.2 - 44.0 %   MCV 90.8 80.0 - 100.0 fL   MCH 31.5 26.0 - 34.0 pg   MCHC 34.7 30.0 - 36.0 g/dL   RDW 10.2 72.5 - 36.6 %   Platelets 209 150 - 400 K/uL   nRBC 0.0 0.0 - 0.2 %    Comment: Performed at [facility] Lab, [address], [place], [place] [address]  Basic metabolic  panel     Status: Abnormal   Collection Time: 08/27/22  3:47 AM  Result Value Ref Range   Sodium 134 (L) 135 - 145 mmol/L   Potassium 3.5 3.5 - 5.1 mmol/L   Chloride 100 98 - 111 mmol/L   CO2 26 22 - 32 mmol/L   Glucose, Bld 132 (H) 70 - 99 mg/dL    Comment: Glucose reference range applies only to samples taken after fasting for at least 8 hours.   BUN 7 6 - 20 mg/dL   Creatinine, Ser 7.42 0.61 - 1.24 mg/dL   Calcium 7.9 (L) 8.9 - 10.3 mg/dL   GFR, Estimated >59 >56 mL/min    Comment: (NOTE) Calculated using the CKD-EPI Creatinine Equation (2021)    Anion gap 8 5 - 15    Comment: Performed at [facility], [address], [place], [place] [address]  CBC     Status: Abnormal   Collection Time: 08/28/22 10:20 AM  Result Value Ref Range   WBC 8.3 4.0 - 10.5 K/uL   RBC 3.42 (L) 4.22 - 5.81  MIL/uL   Hemoglobin 11.2 (L) 13.0 - 17.0 g/dL   HCT 04.5 (L) 40.9 - 81.1 %   MCV 91.8 80.0 - 100.0 fL   MCH 32.7 26.0 - 34.0 pg   MCHC 35.7 30.0 - 36.0 g/dL   RDW 91.4 78.2 - 95.6 %   Platelets 260 150 - 400 K/uL   nRBC 0.0 0.0 - 0.2 %    Comment: Performed at [facility], [address] [place], [place] [address]  Basic metabolic panel     Status: Abnormal   Collection Time: 08/28/22 10:20 AM  Result Value Ref Range   Sodium 136 135 - 145 mmol/L   Potassium 3.6 3.5 - 5.1 mmol/L   Chloride 99 98 - 111 mmol/L   CO2 29 22 - 32 mmol/L   Glucose, Bld 111 (H) 70 - 99 mg/dL    Comment: Glucose reference range applies only to samples taken after fasting for at least 8 hours.   BUN 7 6 - 20 mg/dL   Creatinine, Ser 6.57 (L) 0.61 - 1.24 mg/dL   Calcium 8.5 (L) 8.9 - 10.3 mg/dL   GFR, Estimated >84 >69 mL/min    Comment: (NOTE) Calculated using the CKD-EPI Creatinine Equation (2021)    Anion gap 8 5 - 15    Comment: Performed at [facility] Lab, [address], [place], [place] [address]     Complete History and Physical exam available in the office notes  Nolberto Hanlon Manasi Dishon

## 2022-09-04 NOTE — Anesthesia Postprocedure Evaluation (Signed)
Anesthesia Post Note  Patient: Timothy Moody  Procedure(s) Performed: OPEN REDUCTION INTERNAL FIXATION (ORIF) WRIST FRACTURE (Left: Wrist)     Patient location during evaluation: PACU Anesthesia Type: Regional Level of consciousness: awake Pain management: pain level controlled Vital Signs Assessment: post-procedure vital signs reviewed and stable Respiratory status: spontaneous breathing, nonlabored ventilation and respiratory function stable Cardiovascular status: blood pressure returned to baseline and stable Postop Assessment: no apparent nausea or vomiting Anesthetic complications: no   No notable events documented.  Last Vitals:  Vitals:   09/04/22 1817 09/04/22 1832  BP: 119/85 129/85  Pulse: 82 67  Resp: 12 15  Temp: 36.7 C 36.7 C  SpO2: 98% 99%    Last Pain:  Vitals:   09/04/22 1817  TempSrc:   PainSc: 0-No pain                 Lannette Avellino P Oneita Allmon
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
## 2022-09-04 NOTE — Transfer of Care (Signed)
Immediate Anesthesia Transfer of Care Note  Patient: Timothy Moody  Procedure(s) Performed: OPEN REDUCTION INTERNAL FIXATION (ORIF) WRIST FRACTURE (Left: Wrist)  Patient Location: PACU  Anesthesia Type:MAC and Regional  Level of Consciousness: awake, alert , and oriented  Airway & Oxygen Therapy: Patient Spontanous Breathing  Post-op Assessment: Report given to RN  Post vital signs: Reviewed and stable  Last Vitals:  Vitals Value Taken Time  BP 119/85 09/04/22 1817  Temp    Pulse 82 09/04/22 1818  Resp 12 09/04/22 1818  SpO2 99 % 09/04/22 1818  Vitals shown include unvalidated device data.  Last Pain:  Vitals:   09/04/22 1605  TempSrc:   PainSc: 0-No pain      Patients Stated Pain Goal: 2 (09/04/22 1502)  Complications: No notable events documented.

## 2022-09-04 NOTE — Discharge Instructions (Signed)
  Orthopaedic Hand Surgery Discharge Instructions  WEIGHT BEARING STATUS: Non weight bearing on operative extremity  DRESSING CARE: Please keep your dressing/splint/cast clean and dry until your follow-up appointment. You may shower by placing a waterproof covering over your dressing/splint/cast. Contact your surgeon if your splint/cast gets wet. It will need to be changed to prevent skin breakdown.  PAIN CONTROL: First line medications for post operative pain control are Tylenol (acetaminophen) and Motrin (ibuprofen) if you are able to take these medications. If you have been prescribed a medication these can be taken as breakthrough pain medications. Please note that some narcotic pain medication has acetaminophen added and you should never consume more than 4,000mg of acetaminophen in 24-hour period. Please note that if you are given Toradol (ketorolac) you should not take similar medications such as ibuprofen or naproxen.  DISCHARGE MEDICATIONS: If you have been prescribed medication it was sent electronically to your pharmacy. No changes have been made to your home medications.  ICE/ELEVATION: Ice and elevate your injured extremity as needed. Avoid direct contact of ice with skin.   BANDAGE FEELS TOO TIGHT: If your bandage feels too tight, first make sure you are elevating your fingers as much as possible. The outer layer of the bandage can be unwrapped and reapplied more loosely. If no improvement, you may carefully cut the inner layer longitudinally until the pressure has resolved and then rewrap the outer layer. If you are not comfortable with these instructions, please call the office and the bandage can be changed for you.   FOLLOW UP: You will be called after surgery with an appointment date and time, however if you have not received a phone call within 3 days, please call during regular office hours at [phone] to schedule a post operative appointment.  Please Seek Medical Attention  if: Call MD for: pain or pressure in chest, jaw, arm, back, neck  Call MD for: temperature greater than 101 F for more than 24 hrs Call MD for: difficulty breathing Call MD for: incision redness, bleeding, drainage  Call MD for: palpitations or feeling that the heart is racing  Call MD for: increased swelling in arm, leg, ankle, or abdomen  Call MD for: lightheadedness, dizziness, fainting Call 911 or go to ER for any medical emergency if you are not able to get in touch with your doctor   J. Reid Ben Sanz, MD Orthopaedic Hand Surgeon [facility] Office number: [phone] [address] [place], [place] [address]  

## 2022-09-04 NOTE — Op Note (Signed)
OPERATIVE NOTE  DATE OF PROCEDURE: 09/04/2022  SURGEONS: Primary: Gomez Cleverly, MD  ASSISTANT: Payton Mccallum, PA-C  Due to the complexity of the surgery an assistant was necessary to aid in retraction, exposure, limb positioning, closure and dressing application. The use of an assistant on this case follows CMS and CPT guidelines, which allows an assistant to be used because of the complexity level of this case.   PREOPERATIVE DIAGNOSIS: Left wrist fracture  POSTOPERATIVE DIAGNOSIS: Same  NAME OF PROCEDURE:    Left distal radius open reduction internal fixation, intra- articular, 3 fragments 2.    Left wrist brachioradialis tenotomy  3.    Left wrist radiographs four views with intraoperative interpretation  ANESTHESIA: Regional Block + MAC  SKIN PREPARATION: Hibiclens  ESTIMATED BLOOD LOSS: Minimal  IMPLANTS: Biomet DVR Crosslock volar plate and screws  Implant Name Type Inv. Item Serial No. Manufacturer Lot No. LRB No. Used Action  PLATE STD DVR LEFT - [id number] Plate PLATE STD DVR LEFT  ZIMMER RECON(ORTH,TRAU,BIO,SG)  Left 1 Implanted  PEG LOCKING SMOOTH 2.2X18 - [id number] Peg PEG LOCKING SMOOTH 2.2X18  ZIMMER RECON(ORTH,TRAU,BIO,SG)  Left 4 Implanted  PEG LOCKING SMOOTH 2.2X20 - [id number] Screw PEG LOCKING SMOOTH 2.2X20  ZIMMER RECON(ORTH,TRAU,BIO,SG)  Left 1 Implanted  PEG LOCKING SMOOTH 2.2X16 - [id number] Screw PEG LOCKING SMOOTH 2.2X16  ZIMMER RECON(ORTH,TRAU,BIO,SG)  Left 1 Implanted  SCREW LP NL 2.7X16MM - [id number] Screw SCREW LP NL 2.7X16MM  ZIMMER RECON(ORTH,TRAU,BIO,SG)  Left 2 Implanted  SCREW NONLOCK 2.7X18MM - [id number] Screw SCREW NONLOCK 2.7X18MM  ZIMMER RECON(ORTH,TRAU,BIO,SG)  Left 1 Implanted  SCREW LOCKING 2.7X16 - [id number] Screw SCREW LOCKING 2.7X16  ZIMMER RECON(ORTH,TRAU,BIO,SG)  Left 1 Implanted    INDICATIONS:  Timothy Moody is a 37 y.o. male who has the above preoperative diagnosis. The patient has decided to proceed with surgical intervention.   Risks, benefits and alternatives of operative management were discussed including, but not limited to, risks of anesthesia complications, infection, pain, persistent symptoms, stiffness, need for future surgery.  The patient understands, agrees and elects to proceed with surgery.    DESCRIPTION OF PROCEDURE: The patient was met in the pre-operative area and their identity was verified.  The operative location and laterality was also verified and marked.  The patient was brought to the OR and was placed supine on the table.  After repeat patient identification with the operative team anesthesia was provided and the patient was prepped and draped in the usual sterile fashion.  A final timeout was performed verifying the correction patient, procedure, location and laterality.  Preoperative antibiotics were administered. The left upper extremity was exsanguinated with an Esmarch and tourniquet inflated to . Under loupe magnification, an incision was made directly over the flexor carpi radialis (FCR) tendon. Bipolar was utilized for hemostasis. The roof of the FCR tendon sheath was incised. The FCR tendon was then retracted ulnarly to protect the palmar cutaneous branch of the median nerve. Subsequently, the floor of the FCR tendon sheath was incised over the distal end of the radius.  The flexor pollicis longus (FPL) was swept ulnarly to reveal the pronator quadratus.  The pronator quadratus fascia was incised from its distal and radial borders. A periosteal elevator was utilized to mobilize the pronator quadratus muscle off the distal radius.  The fracture site was irrigated and prepared for reduction with a freer and adson forceps. There were 3 distal radius fracture fragments. The brachioradialis tendon insertion was released to facilitate reduction.  This release was performed by identifying the  broad insertion of the brachiradialis tendon and also identifying the 1st dorsal compartment tendons.  The 1st  dorsal compartment tendons were protected, the broad tendon insertion was released under direct visualization. The fracture was reduced and provisionally fixed with K-wires. We then selected a proper length and width volar plate.  The plate was placed on the distal end of the radius with the fracture reduced and secured to the bone. Using mini C-arm the fracture reduction and position of the plate were deemed to be satisfactory.  We proceeded with securing the plate to the radius with the 1 bicortical nonlocking screw in the oblong portion of the shaft.  With the intermediate column reduced, we secured the distal end of the plate with 2 screws in the distal ulnar portion of plate.  We again used the C-arm to verify satisfactory plate position as well as fracture reduction. The radial column was then reduced and radial styloid locking screws were placed. The remainder shaft screws were placed through the plate. We used the mini C-arm to verify satisfactory plate position, screw lengths and fracture reduction. The DRUJ was then tested in neutral, pronation and supination and was found to be stable.The tourniquet was deflated. Meticulous hemostasis was obtained. The incision was copiously irrigated with normal saline and closed with interrupted 4-0 absorbable horizontal mattress sutures. The incision was covered with adaptic, sterile guaze, webril and well padded short arm splint. The fingers were pink, warm and well perfused. All counts were correct. The patient was awoken from anesthesia and brought to PACU for recovery in stable condition.   [name], MD Orthopaedic Hand Surgery

## 2022-09-04 NOTE — Anesthesia Procedure Notes (Signed)
Anesthesia Regional Block: Supraclavicular block   Pre-Anesthetic Checklist: , timeout performed,  Correct Patient, Correct Site, Correct Laterality,  Correct Procedure, Correct Position, site marked,  Risks and benefits discussed,  Surgical consent,  Pre-op evaluation,  At surgeon's request and post-op pain management  Laterality: Left  Prep: Maximum Sterile Barrier Precautions used, chloraprep       Needles:  Injection technique: Single-shot  Needle Type: Echogenic Stimulator Needle     Needle Length: 9cm  Needle Gauge: 22     Additional Needles:   Procedures:,,,, ultrasound used (permanent image in chart),,    Narrative:  Start time: 09/04/2022 4:00 PM End time: 09/04/2022 4:05 PM Injection made incrementally with aspirations every 5 mL.  Performed by: Personally  Anesthesiologist: Lannie Fields, DO  Additional Notes: Monitors applied. No increased pain on injection. No increased resistance to injection. Injection made in 5cc increments. Good needle visualization. Patient tolerated procedure well.

## 2022-09-06 ENCOUNTER — Encounter (HOSPITAL_COMMUNITY): Payer: Self-pay | Admitting: Orthopedic Surgery

## 2022-09-09 ENCOUNTER — Encounter (HOSPITAL_COMMUNITY): Payer: Self-pay | Admitting: Orthopedic Surgery

## 2022-09-09 ENCOUNTER — Ambulatory Visit: Payer: Self-pay

## 2022-09-09 DIAGNOSIS — Z4802 Encounter for removal of sutures: Secondary | ICD-10-CM

## 2022-09-09 DIAGNOSIS — S36031D Moderate laceration of spleen, subsequent encounter: Secondary | ICD-10-CM | POA: Diagnosis not present

## 2022-09-09 DIAGNOSIS — S2242XD Multiple fractures of ribs, left side, subsequent encounter for fracture with routine healing: Secondary | ICD-10-CM | POA: Diagnosis not present

## 2022-09-09 NOTE — Progress Notes (Signed)
Patient arrived for nurse visit to remove suture/staples post- procedure Thoracotomy/rib plating.  Sutures removed with no signs/ symptoms of infection noted.  Incision well approximated. Patient tolerated procedure well.  Patient/ family instructed to keep the incision sites clean and dry.  Patient/ family acknowledged instructions given.

## 2022-09-10 ENCOUNTER — Encounter (HOSPITAL_COMMUNITY): Payer: Self-pay | Admitting: Orthopedic Surgery

## 2022-09-11 ENCOUNTER — Encounter (HOSPITAL_COMMUNITY): Payer: Self-pay | Admitting: Orthopedic Surgery

## 2022-09-16 ENCOUNTER — Ambulatory Visit
Admission: RE | Admit: 2022-09-16 | Discharge: 2022-09-16 | Disposition: A | Discharge status: Home / Self Care | Payer: 59 | Source: Ambulatory Visit | Attending: Thoracic Surgery (Cardiothoracic Vascular Surgery) | Admitting: Thoracic Surgery (Cardiothoracic Vascular Surgery)

## 2022-09-16 ENCOUNTER — Ambulatory Visit (INDEPENDENT_AMBULATORY_CARE_PROVIDER_SITE_OTHER): Payer: Self-pay | Admitting: Thoracic Surgery (Cardiothoracic Vascular Surgery)

## 2022-09-16 ENCOUNTER — Encounter: Payer: Self-pay | Admitting: Thoracic Surgery (Cardiothoracic Vascular Surgery)

## 2022-09-16 ENCOUNTER — Other Ambulatory Visit: Payer: Self-pay | Admitting: Thoracic Surgery (Cardiothoracic Vascular Surgery)

## 2022-09-16 VITALS — BP 128/83 | HR 89 | Resp 18 | Wt 162.0 lb

## 2022-09-16 DIAGNOSIS — Z9889 Other specified postprocedural states: Secondary | ICD-10-CM

## 2022-09-16 DIAGNOSIS — Z09 Encounter for follow-up examination after completed treatment for conditions other than malignant neoplasm: Secondary | ICD-10-CM

## 2022-09-16 DIAGNOSIS — J90 Pleural effusion, not elsewhere classified: Secondary | ICD-10-CM | POA: Diagnosis not present

## 2022-09-16 MED ORDER — OXYCODONE HCL 5 MG PO TABS: 5.0000 mg | ORAL_TABLET | Freq: Four times a day (QID) | ORAL | 0 refills | Status: DC | PRN

## 2022-09-16 MED ORDER — METHOCARBAMOL 500 MG PO TABS: 500.0000 mg | ORAL_TABLET | Freq: Three times a day (TID) | ORAL | 2 refills | Status: DC | PRN
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
## 2022-09-17 ENCOUNTER — Telehealth: Payer: Self-pay | Admitting: *Deleted

## 2022-09-17 ENCOUNTER — Encounter: Payer: Self-pay | Admitting: *Deleted

## 2022-09-17 NOTE — Telephone Encounter (Signed)
Patient's wife called asking if patient is cleared to fly next week to [place]. Per Dr. Dorris Fetch, patient advised to wait 6 weeks until flying to avoid pneumothorax. Patient verbalized understanding.

## 2022-09-18 DIAGNOSIS — S52502A Unspecified fracture of the lower end of left radius, initial encounter for closed fracture: Secondary | ICD-10-CM | POA: Diagnosis not present

## 2022-09-29 ENCOUNTER — Other Ambulatory Visit: Payer: Self-pay | Admitting: Thoracic Surgery (Cardiothoracic Vascular Surgery)

## 2022-09-29 ENCOUNTER — Telehealth: Payer: Self-pay | Admitting: *Deleted

## 2022-09-29 MED ORDER — OXYCODONE HCL 5 MG PO TABS: 5.0000 mg | ORAL_TABLET | Freq: Three times a day (TID) | ORAL | 0 refills | Status: DC | PRN

## 2022-09-29 NOTE — Progress Notes (Signed)
Reordered oxycodone 5 mg PO Q8 PRN, # 20, no refills  Wilda Wetherell C. Dorris Fetch, MD [facility] [phone]

## 2022-09-29 NOTE — Telephone Encounter (Signed)
Patient contacted after pain medication refill request. Patient requesting oxy. States he takes 1 nightly to help with pain and sleep. Dr. Dorris Fetch notified of requesting. Refill sent to patient's preferred pharmacy. Patient notified.

## 2022-10-09 ENCOUNTER — Encounter: Payer: Self-pay | Admitting: Thoracic Surgery (Cardiothoracic Vascular Surgery)

## 2022-10-13 DIAGNOSIS — M25632 Stiffness of left wrist, not elsewhere classified: Secondary | ICD-10-CM | POA: Diagnosis not present

## 2022-10-13 DIAGNOSIS — S52502D Unspecified fracture of the lower end of left radius, subsequent encounter for closed fracture with routine healing: Secondary | ICD-10-CM | POA: Diagnosis not present

## 2022-10-20 ENCOUNTER — Other Ambulatory Visit: Payer: Self-pay | Admitting: Thoracic Surgery (Cardiothoracic Vascular Surgery)

## 2022-10-20 DIAGNOSIS — Z9889 Other specified postprocedural states: Secondary | ICD-10-CM

## 2022-10-21 ENCOUNTER — Encounter: Payer: Self-pay | Admitting: Thoracic Surgery (Cardiothoracic Vascular Surgery)

## 2022-10-21 ENCOUNTER — Ambulatory Visit
Admission: RE | Admit: 2022-10-21 | Discharge: 2022-10-21 | Disposition: A | Discharge status: Home / Self Care | Payer: 59 | Source: Ambulatory Visit | Attending: Thoracic Surgery (Cardiothoracic Vascular Surgery) | Admitting: Thoracic Surgery (Cardiothoracic Vascular Surgery)

## 2022-10-21 ENCOUNTER — Ambulatory Visit (INDEPENDENT_AMBULATORY_CARE_PROVIDER_SITE_OTHER): Payer: Self-pay | Admitting: Thoracic Surgery (Cardiothoracic Vascular Surgery)

## 2022-10-21 VITALS — BP 130/83 | HR 70 | Resp 20 | Ht 75.0 in | Wt 170.0 lb

## 2022-10-21 DIAGNOSIS — Z9889 Other specified postprocedural states: Secondary | ICD-10-CM

## 2022-10-21 DIAGNOSIS — Z48813 Encounter for surgical aftercare following surgery on the respiratory system: Secondary | ICD-10-CM | POA: Diagnosis not present
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
## 2022-10-23 DIAGNOSIS — M25632 Stiffness of left wrist, not elsewhere classified: Secondary | ICD-10-CM | POA: Diagnosis not present

## 2022-10-28 DIAGNOSIS — M25632 Stiffness of left wrist, not elsewhere classified: Secondary | ICD-10-CM | POA: Diagnosis not present

## 2022-10-30 DIAGNOSIS — M25632 Stiffness of left wrist, not elsewhere classified: Secondary | ICD-10-CM | POA: Diagnosis not present

## 2022-11-04 DIAGNOSIS — M25632 Stiffness of left wrist, not elsewhere classified: Secondary | ICD-10-CM | POA: Diagnosis not present

## 2022-11-06 DIAGNOSIS — M25632 Stiffness of left wrist, not elsewhere classified: Secondary | ICD-10-CM | POA: Diagnosis not present

## 2022-11-11 DIAGNOSIS — M25632 Stiffness of left wrist, not elsewhere classified: Secondary | ICD-10-CM | POA: Diagnosis not present

## 2022-11-12 ENCOUNTER — Telehealth: Payer: Self-pay | Admitting: *Deleted

## 2022-11-12 NOTE — Telephone Encounter (Signed)
Patient contacted the office stating he has a lump of fluid above his incision. States it was discussed with Dr. Dorris Fetch at this last follow up appt 7/30. Patient states lump has not enlarged in size but is uncomfortable and is asking for lump to be drained. Patient denies redness or warmth at site. Appt scheduled for patient to see Dr. Dorris Fetch 8/27. Patient verbalized understanding.

## 2022-11-13 DIAGNOSIS — M25632 Stiffness of left wrist, not elsewhere classified: Secondary | ICD-10-CM | POA: Diagnosis not present

## 2022-11-18 ENCOUNTER — Encounter: Payer: Self-pay | Admitting: Thoracic Surgery (Cardiothoracic Vascular Surgery)

## 2022-11-18 ENCOUNTER — Ambulatory Visit (INDEPENDENT_AMBULATORY_CARE_PROVIDER_SITE_OTHER): Payer: Self-pay | Admitting: Thoracic Surgery (Cardiothoracic Vascular Surgery)

## 2022-11-18 VITALS — BP 125/78 | HR 72 | Resp 20 | Ht 75.0 in | Wt 170.0 lb

## 2022-11-18 DIAGNOSIS — M25632 Stiffness of left wrist, not elsewhere classified: Secondary | ICD-10-CM | POA: Diagnosis not present

## 2022-11-18 DIAGNOSIS — Z9889 Other specified postprocedural states: Secondary | ICD-10-CM

## 2022-11-18 DIAGNOSIS — S2249XA Multiple fractures of ribs, unspecified side, initial encounter for closed fracture: Secondary | ICD-10-CM | POA: Insufficient documentation
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
## 2022-11-20 DIAGNOSIS — M25632 Stiffness of left wrist, not elsewhere classified: Secondary | ICD-10-CM | POA: Diagnosis not present

## 2022-11-26 DIAGNOSIS — M25632 Stiffness of left wrist, not elsewhere classified: Secondary | ICD-10-CM | POA: Diagnosis not present

## 2022-11-26 DIAGNOSIS — S52502A Unspecified fracture of the lower end of left radius, initial encounter for closed fracture: Secondary | ICD-10-CM | POA: Diagnosis not present
